# Patient Record
Sex: Female | Born: 1965 | Race: Black or African American | Hispanic: No | Marital: Married | Smoking: Never smoker
Health system: Southern US, Community
[De-identification: ages and names within clinical notes are randomized; demographics above are authoritative.]

## PROBLEM LIST (undated history)

## (undated) DIAGNOSIS — J45909 Unspecified asthma, uncomplicated: Secondary | ICD-10-CM

## (undated) DIAGNOSIS — K219 Gastro-esophageal reflux disease without esophagitis: Secondary | ICD-10-CM

## (undated) DIAGNOSIS — K760 Fatty (change of) liver, not elsewhere classified: Secondary | ICD-10-CM

## (undated) DIAGNOSIS — K829 Disease of gallbladder, unspecified: Secondary | ICD-10-CM

## (undated) DIAGNOSIS — K5900 Constipation, unspecified: Secondary | ICD-10-CM

## (undated) DIAGNOSIS — E785 Hyperlipidemia, unspecified: Secondary | ICD-10-CM

## (undated) DIAGNOSIS — F32A Depression, unspecified: Secondary | ICD-10-CM

## (undated) DIAGNOSIS — R112 Nausea with vomiting, unspecified: Secondary | ICD-10-CM

## (undated) DIAGNOSIS — G4730 Sleep apnea, unspecified: Secondary | ICD-10-CM

## (undated) DIAGNOSIS — I10 Essential (primary) hypertension: Secondary | ICD-10-CM

## (undated) DIAGNOSIS — E559 Vitamin D deficiency, unspecified: Secondary | ICD-10-CM

## (undated) DIAGNOSIS — M1990 Unspecified osteoarthritis, unspecified site: Secondary | ICD-10-CM

## (undated) DIAGNOSIS — E119 Type 2 diabetes mellitus without complications: Secondary | ICD-10-CM

## (undated) DIAGNOSIS — J189 Pneumonia, unspecified organism: Secondary | ICD-10-CM

## (undated) DIAGNOSIS — J302 Other seasonal allergic rhinitis: Secondary | ICD-10-CM

## (undated) DIAGNOSIS — F419 Anxiety disorder, unspecified: Secondary | ICD-10-CM

## (undated) DIAGNOSIS — T7840XA Allergy, unspecified, initial encounter: Secondary | ICD-10-CM

## (undated) DIAGNOSIS — M2550 Pain in unspecified joint: Secondary | ICD-10-CM

## (undated) DIAGNOSIS — M549 Dorsalgia, unspecified: Secondary | ICD-10-CM

## (undated) HISTORY — DX: Essential (primary) hypertension: I10

## (undated) HISTORY — DX: Fatty (change of) liver, not elsewhere classified: K76.0

## (undated) HISTORY — DX: Gastro-esophageal reflux disease without esophagitis: K21.9

## (undated) HISTORY — PX: BACK SURGERY: SHX140

## (undated) HISTORY — DX: Hyperlipidemia, unspecified: E78.5

## (undated) HISTORY — DX: Other seasonal allergic rhinitis: J30.2

## (undated) HISTORY — PX: ABDOMINAL HYSTERECTOMY: SHX81

## (undated) HISTORY — DX: Unspecified osteoarthritis, unspecified site: M19.90

## (undated) HISTORY — DX: Dorsalgia, unspecified: M54.9

## (undated) HISTORY — DX: Pain in unspecified joint: M25.50

## (undated) HISTORY — DX: Unspecified asthma, uncomplicated: J45.909

## (undated) HISTORY — DX: Type 2 diabetes mellitus without complications: E11.9

## (undated) HISTORY — DX: Vitamin D deficiency, unspecified: E55.9

## (undated) HISTORY — PX: WRIST SURGERY: SHX841

## (undated) HISTORY — DX: Constipation, unspecified: K59.00

## (undated) HISTORY — DX: Disease of gallbladder, unspecified: K82.9

## (undated) HISTORY — PX: APPENDECTOMY: SHX54

## (undated) HISTORY — DX: Sleep apnea, unspecified: G47.30

## (undated) HISTORY — DX: Allergy, unspecified, initial encounter: T78.40XA

## (undated) HISTORY — DX: Depression, unspecified: F32.A

---

## 2000-04-13 ENCOUNTER — Inpatient Hospital Stay (HOSPITAL_COMMUNITY): Admission: EM | Admit: 2000-04-13 | Discharge: 2000-04-15 | Payer: Self-pay | Admitting: Emergency Medicine

## 2000-04-13 ENCOUNTER — Encounter (INDEPENDENT_AMBULATORY_CARE_PROVIDER_SITE_OTHER): Payer: Self-pay

## 2000-04-13 ENCOUNTER — Encounter: Payer: Self-pay | Admitting: Surgery

## 2000-12-06 ENCOUNTER — Encounter: Payer: Self-pay | Admitting: Family Medicine

## 2000-12-06 ENCOUNTER — Encounter: Admission: RE | Admit: 2000-12-06 | Discharge: 2000-12-06 | Payer: Self-pay | Admitting: Family Medicine

## 2001-06-14 ENCOUNTER — Other Ambulatory Visit: Admission: RE | Admit: 2001-06-14 | Discharge: 2001-06-14 | Payer: Self-pay | Admitting: Obstetrics and Gynecology

## 2003-09-16 ENCOUNTER — Encounter: Admission: RE | Admit: 2003-09-16 | Discharge: 2003-09-16 | Payer: Self-pay | Admitting: Internal Medicine

## 2003-10-22 ENCOUNTER — Inpatient Hospital Stay (HOSPITAL_COMMUNITY): Admission: RE | Admit: 2003-10-22 | Discharge: 2003-10-24 | Payer: Self-pay | Admitting: Neurosurgery

## 2003-11-28 ENCOUNTER — Emergency Department (HOSPITAL_COMMUNITY): Admission: EM | Admit: 2003-11-28 | Discharge: 2003-11-28 | Payer: Self-pay | Admitting: Emergency Medicine

## 2004-04-06 ENCOUNTER — Other Ambulatory Visit: Admission: RE | Admit: 2004-04-06 | Discharge: 2004-04-06 | Payer: Self-pay | Admitting: Obstetrics and Gynecology

## 2006-02-17 ENCOUNTER — Ambulatory Visit (HOSPITAL_COMMUNITY): Admission: RE | Admit: 2006-02-17 | Discharge: 2006-02-17 | Payer: Self-pay | Admitting: Obstetrics and Gynecology

## 2006-06-14 ENCOUNTER — Encounter: Admission: RE | Admit: 2006-06-14 | Discharge: 2006-06-14 | Payer: Self-pay | Admitting: Hematology and Oncology

## 2006-09-15 ENCOUNTER — Emergency Department (HOSPITAL_COMMUNITY): Admission: EM | Admit: 2006-09-15 | Discharge: 2006-09-15 | Payer: Self-pay | Admitting: Emergency Medicine

## 2006-11-19 ENCOUNTER — Emergency Department (HOSPITAL_COMMUNITY): Admission: EM | Admit: 2006-11-19 | Discharge: 2006-11-19 | Payer: Self-pay | Admitting: Emergency Medicine

## 2007-04-17 ENCOUNTER — Emergency Department (HOSPITAL_COMMUNITY): Admission: EM | Admit: 2007-04-17 | Discharge: 2007-04-17 | Payer: Self-pay | Admitting: Emergency Medicine

## 2007-05-13 ENCOUNTER — Emergency Department (HOSPITAL_COMMUNITY): Admission: EM | Admit: 2007-05-13 | Discharge: 2007-05-13 | Payer: Self-pay | Admitting: Emergency Medicine

## 2007-08-02 ENCOUNTER — Ambulatory Visit (HOSPITAL_COMMUNITY): Admission: RE | Admit: 2007-08-02 | Discharge: 2007-08-02 | Payer: Self-pay | Admitting: Family Medicine

## 2008-07-15 ENCOUNTER — Encounter: Admission: RE | Admit: 2008-07-15 | Discharge: 2008-07-15 | Payer: Self-pay | Admitting: Internal Medicine

## 2008-10-28 ENCOUNTER — Encounter: Admission: RE | Admit: 2008-10-28 | Discharge: 2008-10-28 | Payer: Self-pay | Admitting: Emergency Medicine

## 2008-10-30 ENCOUNTER — Encounter: Admission: RE | Admit: 2008-10-30 | Discharge: 2008-10-30 | Payer: Self-pay | Admitting: Emergency Medicine

## 2008-12-23 ENCOUNTER — Encounter: Admission: RE | Admit: 2008-12-23 | Discharge: 2008-12-23 | Payer: Self-pay | Admitting: Neurosurgery

## 2009-01-06 ENCOUNTER — Encounter: Admission: RE | Admit: 2009-01-06 | Discharge: 2009-01-06 | Payer: Self-pay | Admitting: Neurosurgery

## 2009-03-24 ENCOUNTER — Encounter: Admission: RE | Admit: 2009-03-24 | Discharge: 2009-03-24 | Payer: Self-pay | Admitting: Neurosurgery

## 2009-07-28 ENCOUNTER — Encounter: Admission: RE | Admit: 2009-07-28 | Discharge: 2009-07-28 | Payer: Self-pay | Admitting: Neurosurgery

## 2009-11-19 ENCOUNTER — Encounter: Admission: RE | Admit: 2009-11-19 | Discharge: 2009-11-19 | Payer: Self-pay | Admitting: Neurosurgery

## 2010-02-24 ENCOUNTER — Encounter
Admission: RE | Admit: 2010-02-24 | Discharge: 2010-02-24 | Payer: Self-pay | Source: Home / Self Care | Attending: Neurosurgery | Admitting: Neurosurgery

## 2010-03-28 ENCOUNTER — Encounter: Payer: Self-pay | Admitting: Obstetrics and Gynecology

## 2010-06-30 ENCOUNTER — Other Ambulatory Visit: Payer: Self-pay | Admitting: Neurosurgery

## 2010-06-30 DIAGNOSIS — M549 Dorsalgia, unspecified: Secondary | ICD-10-CM

## 2010-07-01 ENCOUNTER — Ambulatory Visit
Admission: RE | Admit: 2010-07-01 | Discharge: 2010-07-01 | Disposition: A | Discharge status: Home / Self Care | Payer: Worker's Compensation | Source: Ambulatory Visit | Attending: Neurosurgery | Admitting: Neurosurgery

## 2010-07-01 DIAGNOSIS — M549 Dorsalgia, unspecified: Secondary | ICD-10-CM
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
## 2010-07-23 NOTE — Discharge Summary (Signed)
[facility]  Patient:    Kristen Holland, Kristen Holland                          MRN: [id number] Adm. Date:  [id number] Disc. Date: [id number] Attending:  Charlton Haws                           Discharge Summary  FINAL DIAGNOSIS:  Acute appendicitis.  HISTORY OF PRESENT ILLNESS:  This patient is a 45 year old who has presented with abdominal pain which began in the right lower abdomen and then moved, actually, into the left lower quadrant somewhat but was associated with some marked tenderness and some slight increase in white count.  CT scan was consistent with an early appendicitis.  HOSPITAL COURSE:  The patient was taken to the operating room and laparoscopic appendectomy performed.  She had a very long appendix which stretched across the midline and pathologically showed acute appendicitis.  She did well postoperatively, was able to be switched to oral pain medications and discharged on the second postoperative day.  She complained of a little calf tenderness, and a Doppler study was negative, and she was able to be discharged on some Mepergan Fortis.  Will follow up in the office in approximately two weeks. DD:  04/25/00 TD:  04/25/00 Job: [id number]

## 2010-07-23 NOTE — Op Note (Signed)
[facility]  Patient:    Kristen Holland, Kristen Holland                          MRN: [id number] Proc. Date: 04/13/00 Adm. Date:  [id number] Attending:  Charlton Haws CC:         Evette Georges, M.D. [facility]   Operative Report  ACCOUNT:  [id number]  PREOPERATIVE DIAGNOSIS:  Early acute appendicitis.  POSTOPERATIVE DIAGNOSIS:  Early acute appendicitis.  OPERATION:  Laparoscopic appendectomy.  SURGEON:  Currie Paris, M.D.  ANESTHESIA:  General endotracheal.  CLINICAL HISTORY:  This patient is a 45 year old woman who has developed abdominal pain over the last 24 hours which has been primary lower abdomen, right as well as left, although it started out on the right side.  No real nausea or vomiting and has been hungry.  White count was 12,000 and because of her clinical findings, we elected to do a CT scan which showed what appeared to be a very long appendix with the distal appendix appearing to be somewhat edematous and actually extending across the midline into the left lower quadrant, consistent with her physical findings.  DESCRIPTION OF PROCEDURE:  The patient was brought to the operating room and after satisfactory general endotracheal anesthesia had been obtained, Foley catheter was placed and the abdomen prepped and draped.  Marcaine 0.25% was used for each incision.  The umbilical incision made first, the fascia opened, and the peritoneal cavity entered under direct vision.  The Hasson was introduced and the abdomen insufflated to 15.  A 5 mm trocar was placed in the right upper quadrant and a 10-11 in the left lower quadrant, taking the instruments through fairly obliquely and being careful to avoid the epigastrium.  The appendix was noted to be long, and the tip did appear to be somewhat edematous with a little bit of thickening of the distal mesoappendix.  It was excessively long.  The appendix was grasped, and I was able to divide  the mesoappendix with the harmonic scalpel down to the base of the appendix.  I then divided the base of the appendix with the GIA, put it in an EndoCatch bag, and brought it out the umbilical port.  The umbilical port was replaced, and we checked for hemostasis.  Everything appeared to be dry.  I could see pulsations right where the appendiceal artery had been divided with the harmonic and although it was completely dry, I put two clips on it for extra security.  There were no other abnormalities noted in the abdomen.  The uterus was surgically absent.  The right ovary appeared to be normal.  I could not visualize the left ovary as it was covered by the sigmoid colon, but there was no evidence of inflammatory process nor any free fluid in the pelvis.  The upper abdomen was normal.  The left lower quadrant port was removed, and there was no bleeding.  The right upper quadrant port was removed, and there was no bleeding.  The abdomen was deflated through the umbilical port and a pursestring tied down.  The skin was closed with 4-0 Monocryl, subcuticular, plus Steri-Strips.  The patient tolerated the procedure well.  There were no operative complications.  All counts were correct.  She was taken to the recovery room in satisfactory condition. DD:  04/14/00 TD:  04/15/00 Job: [id number]

## 2010-07-23 NOTE — H&P (Signed)
[facility]  Patient:    Kristen Holland, Kristen Holland                          MRN: [id number] Proc. Date: 04/13/00 Adm. Date:  [id number] Attending:  Charlton Haws                         History and Physical  CHIEF COMPLAINT:  Abdominal pain.  CLINICAL HISTORY:  This patient is a 45 year old woman who about 24 hours ago developed some abdominal pain that started as sharp pain that really started in the right lower quadrant and has gotten a little bit worse over the last 24 hours. It seems to have moved over towards the midline and also now into the left lower quadrant. She has not had any nausea whatsoever and is in fact currently very hungry. She has not had any diarrhea. Her bowels have been working normally for her. She thought this was starting out as ovulation pain which she says she can tell. She has had a partial hysterectomy but still has her ovaries so she doesnt have menstrual cycles. However, she got concerned because this pain didnt go away as her ovulation pain (which she calls her ovulation pain) usually does. She has had no urinary tract symptoms, no fever or chills. No upper respiratory infection type symptoms.  PAST SURGICAL HISTORY:  She had a tubal ligation followed by a partial hysterectomy.  MEDICATIONS:  She takes Allegra occasionally for allergies.  ALLERGIES:  No known drug allergies.  HABITS:  Smokes none. Alcohol none.  FAMILY HISTORY:  Unremarkable.  REVIEW OF SYSTEMS:  HEENT:  Negative except for the usual URI type problems. CHEST:  No cough or shortness of breath. HEART:  No history of murmurs or hypertension. ABDOMEN:  Negative except for HPI. GU:  Negative except for HPI. EXTREMITIES:  Negative.  PHYSICAL EXAMINATION:  GENERAL:  The patient is a healthy appearing 45 year old who is alert, awake and in no distress.  VITAL SIGNS:  Blood pressure 145/86, pulse 86, temperature 97.5.  HEENT:  Head is normocephalic. Eyes  are nonicteric. Pupils are round and regular.  NECK:  Supple with no masses or thyromegaly.  LUNGS:  Clear to auscultation.  HEART:  Regular, no murmurs, rubs or gallops.  ABDOMEN:  Tender across the lower abdomen, right and left and I think the left seems to be more tender than the right. There is not a lot of rigidity. There is some rebound tenderness present and it seems to be across the lower abdomen and not the upper abdomen. Bowel sounds are present.  RECTAL:  Not done.  EXTREMITIES:  Show no cyanosis or edema.  LABORATORY DATA:  Show a normal CMET. The white count is 12,900. Urinalysis appears negative.  IMPRESSION:  Abdominal pain of uncertain etiology.  PLAN:  Will go ahead and get a CT because I dont think this is quite a classical appendicitis although this is most likely what this represents. Once that is done we can make further plans. DD:  04/13/00 TD:  04/15/00 Job: [id number]
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
## 2010-08-24 ENCOUNTER — Other Ambulatory Visit (HOSPITAL_COMMUNITY): Payer: Self-pay | Admitting: Gastroenterology

## 2010-09-16 ENCOUNTER — Encounter (HOSPITAL_COMMUNITY)
Admission: RE | Admit: 2010-09-16 | Discharge: 2010-09-16 | Disposition: A | Discharge status: Home / Self Care | Payer: BC Managed Care – PPO | Source: Ambulatory Visit | Attending: Gastroenterology | Admitting: Gastroenterology

## 2010-09-16 ENCOUNTER — Ambulatory Visit (HOSPITAL_COMMUNITY)
Admission: RE | Admit: 2010-09-16 | Discharge: 2010-09-16 | Disposition: A | Discharge status: Home / Self Care | Payer: Worker's Compensation | Source: Ambulatory Visit | Attending: Gastroenterology | Admitting: Gastroenterology

## 2010-09-16 ENCOUNTER — Encounter (HOSPITAL_COMMUNITY): Payer: Self-pay

## 2010-09-16 DIAGNOSIS — R109 Unspecified abdominal pain: Secondary | ICD-10-CM | POA: Insufficient documentation

## 2010-09-16 DIAGNOSIS — R112 Nausea with vomiting, unspecified: Secondary | ICD-10-CM | POA: Insufficient documentation

## 2010-09-16 HISTORY — DX: Nausea with vomiting, unspecified: R11.2

## 2010-09-16 MED ORDER — SINCALIDE 5 MCG IJ SOLR: 0.0200 ug/kg | Freq: Once | INTRAMUSCULAR | Status: DC

## 2010-09-16 MED ORDER — TECHNETIUM TC 99M MEBROFENIN IV KIT
5.5000 | PACK | Freq: Once | INTRAVENOUS | Status: AC | PRN
  Administered 2010-09-16: 5.5 via INTRAVENOUS

## 2010-10-08 ENCOUNTER — Other Ambulatory Visit: Payer: Self-pay | Admitting: Neurosurgery

## 2010-10-08 DIAGNOSIS — S39012A Strain of muscle, fascia and tendon of lower back, initial encounter: Secondary | ICD-10-CM

## 2010-10-14 ENCOUNTER — Ambulatory Visit
Admission: RE | Admit: 2010-10-14 | Discharge: 2010-10-14 | Disposition: A | Discharge status: Home / Self Care | Payer: Worker's Compensation | Source: Ambulatory Visit | Attending: Neurosurgery | Admitting: Neurosurgery

## 2010-10-14 DIAGNOSIS — M549 Dorsalgia, unspecified: Secondary | ICD-10-CM

## 2010-10-14 DIAGNOSIS — S39012A Strain of muscle, fascia and tendon of lower back, initial encounter: Secondary | ICD-10-CM

## 2010-10-14 MED ORDER — IOHEXOL 180 MG/ML  SOLN
17.0000 mL | Freq: Once | INTRAMUSCULAR | Status: AC | PRN
  Administered 2010-10-14: 17 mL via INTRATHECAL

## 2010-10-14 MED ORDER — DIAZEPAM 2 MG PO TABS
10.0000 mg | ORAL_TABLET | Freq: Once | ORAL | Status: AC
  Administered 2010-10-14: 10 mg via ORAL

## 2010-10-14 MED ORDER — SODIUM CHLORIDE 0.9 % IV SOLN: 4.0000 mg | Freq: Four times a day (QID) | INTRAVENOUS | Status: DC | PRN

## 2010-10-14 NOTE — Progress Notes (Signed)
Pt resting quietly without complaint

## 2010-11-16 ENCOUNTER — Other Ambulatory Visit: Payer: Self-pay | Admitting: Gastroenterology

## 2010-11-17 ENCOUNTER — Ambulatory Visit
Admission: RE | Admit: 2010-11-17 | Discharge: 2010-11-17 | Disposition: A | Discharge status: Home / Self Care | Payer: BC Managed Care – PPO | Source: Ambulatory Visit | Attending: Gastroenterology | Admitting: Gastroenterology

## 2010-12-17 LAB — CBC
HCT: 39.1
MCHC: 35.1
MCV: 81.1
Platelets: 376
RDW: 12.6

## 2010-12-17 LAB — COMPREHENSIVE METABOLIC PANEL
AST: 18
Albumin: 3.9
BUN: 8
Calcium: 9.4
Chloride: 106
Creatinine, Ser: 0.89
GFR calc Af Amer: >60
Total Bilirubin: 0.7
Total Protein: 7.2

## 2010-12-17 LAB — DIFFERENTIAL
Basophils Absolute: 0.1
Lymphocytes Relative: 30
Lymphs Abs: 2.9
Monocytes Absolute: 0.5
Neutro Abs: 5.9

## 2011-01-06 ENCOUNTER — Ambulatory Visit
Admission: RE | Admit: 2011-01-06 | Discharge: 2011-01-06 | Disposition: A | Discharge status: Home / Self Care | Payer: BC Managed Care – PPO | Source: Ambulatory Visit | Attending: Family Medicine | Admitting: Family Medicine

## 2011-01-06 ENCOUNTER — Other Ambulatory Visit: Payer: Self-pay | Admitting: Family Medicine

## 2011-01-06 DIAGNOSIS — R52 Pain, unspecified: Secondary | ICD-10-CM

## 2011-10-16 ENCOUNTER — Ambulatory Visit: Payer: BC Managed Care – PPO

## 2011-10-16 ENCOUNTER — Ambulatory Visit (INDEPENDENT_AMBULATORY_CARE_PROVIDER_SITE_OTHER): Payer: BC Managed Care – PPO | Admitting: Emergency Medicine

## 2011-10-16 VITALS — BP 130/86 | HR 88 | Temp 98.1°F | Resp 22 | Ht 65.0 in | Wt 209.6 lb

## 2011-10-16 DIAGNOSIS — K5900 Constipation, unspecified: Secondary | ICD-10-CM

## 2011-10-16 DIAGNOSIS — R109 Unspecified abdominal pain: Secondary | ICD-10-CM

## 2011-10-16 DIAGNOSIS — R062 Wheezing: Secondary | ICD-10-CM

## 2011-10-16 LAB — POCT URINALYSIS DIPSTICK
Ketones, UA: NEGATIVE
Protein, UA: NEGATIVE
Spec Grav, UA: 1.02
pH, UA: 7

## 2011-10-16 LAB — POCT CBC
HCT, POC: 42.2 % (ref 37.7–47.9)
Lymph, poc: 2.8 (ref 0.6–3.4)
MCHC: 31 g/dL — AB (ref 31.8–35.4)
MCV: 86.7 fL (ref 80–97)
MID (cbc): 0.5 (ref 0–0.9)
POC Granulocyte: 5 (ref 2–6.9)
POC LYMPH PERCENT: 33.6 %L (ref 10–50)
Platelet Count, POC: 393 10*3/uL (ref 142–424)
RDW, POC: 12.5 %

## 2011-10-16 LAB — POCT UA - MICROSCOPIC ONLY
Casts, Ur, LPF, POC: NEGATIVE
Crystals, Ur, HPF, POC: NEGATIVE
Yeast, UA: NEGATIVE

## 2011-10-16 MED ORDER — DICYCLOMINE HCL 20 MG PO TABS: 20.0000 mg | ORAL_TABLET | Freq: Four times a day (QID) | ORAL | Status: DC

## 2011-10-16 NOTE — Patient Instructions (Addendum)

## 2011-10-16 NOTE — Progress Notes (Signed)
  Subjective:    Patient ID: Kristen Holland, female    DOB: 06-Aug-1965, 46 y.o.   MRN: [id number]  HPI patient had an onset Wednesday night of right lower abdominal pain. She is status post hysterectomy and status post appendectomy. She still has her right ovary. She's had no fever. She has had no nausea or vomiting. She had one episode of loose stools. She has no dysuria or urinary frequency. Her pain level is approximately 3/10 aching sensation in the right lower abdomen.    Review of Systems     Objective:   Physical Exam  Constitutional: She appears well-developed and well-nourished.  Eyes: Pupils are equal, round, and reactive to light.  Neck: No thyromegaly present.  Cardiovascular: Normal rate and regular rhythm.   Pulmonary/Chest: Effort normal and breath sounds normal.  Abdominal: Soft. Bowel sounds are normal. She exhibits no mass. There is tenderness. There is guarding. There is no rebound.       The area of tenderness is right mid to lower abdomen   [facility] reading (PRIMARY) by  Dr.Keddrick Wyne is a large amount of stool burden involving the entire colon   Results for orders placed in visit on 10/16/11  POCT CBC      Component Value Range   WBC 8.3  4.6 - 10.2 K/uL   Lymph, poc 2.8  0.6 - 3.4   POC LYMPH PERCENT 33.6  10 - 50 %L   MID (cbc) 0.5  0 - 0.9   POC MID % 6.6  0 - 12 %M   POC Granulocyte 5.0  2 - 6.9   Granulocyte percent 59.8  37 - 80 %G   RBC 4.87  4.04 - 5.48 M/uL   Hemoglobin 13.1  12.2 - 16.2 g/dL   HCT, POC 40.9  81.1 - 47.9 %   MCV 86.7  80 - 97 fL   MCH, POC 26.9 (*) 27 - 31.2 pg   MCHC 31.0 (*) 31.8 - 35.4 g/dL   RDW, POC 91.4     Platelet Count, POC 393  142 - 424 K/uL   MPV 9.4  0 - 99.8 fL  POCT URINALYSIS DIPSTICK      Component Value Range   Color, UA yellow     Clarity, UA clear     Glucose, UA neg     Bilirubin, UA neg     Ketones, UA neg     Spec Grav, UA 1.020     Blood, UA neg     pH, UA 7.0     Protein, UA neg     Urobilinogen, UA 0.2       Nitrite, UA neg     Leukocytes, UA Negative    POCT UA - MICROSCOPIC ONLY      Component Value Range   WBC, Ur, HPF, POC 2-3     RBC, urine, microscopic 0-1     Bacteria, U Microscopic trace     Mucus, UA small     Epithelial cells, urine per micros 2-3     Crystals, Ur, HPF, POC neg     Casts, Ur, LPF, POC neg     Yeast, UA neg         Assessment & Plan:  Abdominal pain secondary to constipation. We will treat this with Bentyl for cramping and give her MiraLax to see if we can get this to resolve.

## 2011-11-16 ENCOUNTER — Other Ambulatory Visit: Payer: Self-pay | Admitting: Gastroenterology

## 2011-11-16 DIAGNOSIS — R1011 Right upper quadrant pain: Secondary | ICD-10-CM

## 2011-11-16 DIAGNOSIS — R110 Nausea: Secondary | ICD-10-CM

## 2011-11-24 ENCOUNTER — Ambulatory Visit (HOSPITAL_COMMUNITY)
Admission: RE | Admit: 2011-11-24 | Discharge: 2011-11-24 | Disposition: A | Discharge status: Home / Self Care | Payer: BC Managed Care – PPO | Source: Ambulatory Visit | Attending: Gastroenterology | Admitting: Gastroenterology

## 2011-11-24 ENCOUNTER — Other Ambulatory Visit (HOSPITAL_COMMUNITY): Payer: Self-pay | Admitting: General Practice

## 2011-11-24 DIAGNOSIS — I10 Essential (primary) hypertension: Secondary | ICD-10-CM | POA: Insufficient documentation

## 2011-11-24 DIAGNOSIS — R1011 Right upper quadrant pain: Secondary | ICD-10-CM

## 2011-11-24 DIAGNOSIS — E119 Type 2 diabetes mellitus without complications: Secondary | ICD-10-CM | POA: Insufficient documentation

## 2011-11-24 DIAGNOSIS — R110 Nausea: Secondary | ICD-10-CM

## 2011-11-24 DIAGNOSIS — K7689 Other specified diseases of liver: Secondary | ICD-10-CM | POA: Insufficient documentation

## 2011-11-24 MED ORDER — SINCALIDE 5 MCG IJ SOLR
0.0200 ug/kg | Freq: Once | INTRAMUSCULAR | Status: AC
  Administered 2011-11-24: 1.9 ug via INTRAVENOUS

## 2011-11-24 MED ORDER — TECHNETIUM TC 99M MEBROFENIN IV KIT
5.0000 | PACK | Freq: Once | INTRAVENOUS | Status: AC | PRN
  Administered 2011-11-24: 5 via INTRAVENOUS

## 2011-12-29 ENCOUNTER — Ambulatory Visit (INDEPENDENT_AMBULATORY_CARE_PROVIDER_SITE_OTHER): Payer: BC Managed Care – PPO | Admitting: General Surgery

## 2011-12-29 ENCOUNTER — Encounter (INDEPENDENT_AMBULATORY_CARE_PROVIDER_SITE_OTHER): Payer: Self-pay | Admitting: General Surgery

## 2011-12-29 VITALS — BP 123/84 | HR 82 | Temp 98.6°F | Resp 14 | Ht 65.0 in | Wt 215.4 lb

## 2011-12-29 DIAGNOSIS — E119 Type 2 diabetes mellitus without complications: Secondary | ICD-10-CM

## 2011-12-29 DIAGNOSIS — R1011 Right upper quadrant pain: Secondary | ICD-10-CM

## 2011-12-29 DIAGNOSIS — K5900 Constipation, unspecified: Secondary | ICD-10-CM | POA: Insufficient documentation

## 2011-12-29 NOTE — Progress Notes (Signed)
Chief Complaint  Patient presents with  . Abdominal Pain    gallbladder    HISTORY:  Kristen Holland is a 46 y.o. female who presents to clinic with RUQ pain.  Her pain appears to be episodic.  She is currently not having much pain since starting the Dexilant.  She states that is occurs about 30 mins after eating.  It is worse with spicy foods.  She also complains of occasional constipation.  Past Medical History  Diagnosis Date  . Abdominal pain   . Nausea & vomiting   . Diabetes mellitus without complication   . Hyperlipidemia   . Hypertension   . GERD (gastroesophageal reflux disease)   . Asthma   . Arthritis        History reviewed. No pertinent past surgical history.    Current Outpatient Prescriptions  Medication Sig Dispense Refill  . dicyclomine (BENTYL) 20 MG tablet Take 1 tablet (20 mg total) by mouth every 6 (six) hours.  30 tablet  o  . Liraglutide (VICTOZA) 18 MG/3ML SOLN Inject into the skin daily.      . metFORMIN (GLUCOPHAGE) 500 MG tablet Take 500 mg by mouth daily with breakfast.      . montelukast (SINGULAIR) 10 MG tablet Take 10 mg by mouth at bedtime.      . simvastatin (ZOCOR) 20 MG tablet Take 20 mg by mouth every evening.      . valsartan-hydrochlorothiazide (DIOVAN-HCT) 160-12.5 MG per tablet Take 1 tablet by mouth daily.         No Known Allergies    History reviewed. No pertinent family history.    History   Social History  . Marital Status: Married    Spouse Name: N/A    Number of Children: N/A  . Years of Education: N/A   Social History Main Topics  . Smoking status: Never Smoker   . Smokeless tobacco: None  . Alcohol Use: No  . Drug Use: No  . Sexually Active: None   Other Topics Concern  . None   Social History Narrative  . None       REVIEW OF SYSTEMS - PERTINENT POSITIVES ONLY: Review of Systems - General ROS: negative for - chills, fever, weight gain or weight loss Hematological and Lymphatic ROS: negative for - bleeding  problems, blood clots or jaundice Respiratory ROS: no cough, shortness of breath, or wheezing Cardiovascular ROS: no chest pain or dyspnea on exertion Gastrointestinal ROS: negative for - blood in stools or nausea/vomiting Pain in RUQ  EXAM: Filed Vitals:   12/29/11 1500  BP: 123/84  Pulse: 82  Temp: 98.6 F (37 C)  Resp: 14    General appearance: alert, cooperative and no distress Resp: clear to auscultation bilaterally Cardio: regular rate and rhythm GI: soft, obese, tender to palpation in RUQ, Neg Holman's sign   LABORATORY RESULTS: Available labs are reviewed  Lab Results  Component Value Date   ALT 22 11/19/2006   AST 18 11/19/2006   ALKPHOS 49 11/19/2006   BILITOT 0.7 11/19/2006     RADIOLOGY RESULTS:   Images and reports are reviewed. RUQ US IMPRESSION:  1. No evidence of cholelithiasis, hydronephrosis, or other acute findings.  2. Mild hepatic steatosis.  HIDA IMPRESSION:  No cystic duct obstruction. Post CCK gallbladder ejection fraction is 28.5%. Normal gallbladder ejection fraction should be greater than 30%.    ASSESSMENT AND PLAN: Kristen Holland is a 46 y.o. female who was referred to me for RUQ pain.  This is episodic in nature.  It is getting better on her Dexilant.  I discussed with her the risks and benefits of surgery.  I told her that 80% of people with her findings on HIDA will get better after cholecystectomy.  She is currently feeling better and has decided to wait for now.  I feel that this is perfectly safe since she has no gallstones.  She is going to try to work on her constipation and weight loss.  She will call the office if her symptoms worsen.      Vanita Panda, MD Colon and Rectal Surgery / General Surgery [facility]      Visit Diagnoses: 1. RUQ pain     Primary Care Physician: Gwynneth Aliment, MD

## 2011-12-29 NOTE — Patient Instructions (Signed)
If your pain gets worse, please call the office.

## 2012-03-22 ENCOUNTER — Ambulatory Visit (INDEPENDENT_AMBULATORY_CARE_PROVIDER_SITE_OTHER): Payer: BC Managed Care – PPO | Admitting: Family Medicine

## 2012-03-22 VITALS — BP 135/84 | HR 121 | Temp 97.9°F | Resp 16 | Ht 67.0 in | Wt 216.0 lb

## 2012-03-22 DIAGNOSIS — E119 Type 2 diabetes mellitus without complications: Secondary | ICD-10-CM

## 2012-03-22 DIAGNOSIS — E1169 Type 2 diabetes mellitus with other specified complication: Secondary | ICD-10-CM

## 2012-03-22 DIAGNOSIS — M79605 Pain in left leg: Secondary | ICD-10-CM

## 2012-03-22 DIAGNOSIS — M545 Low back pain, unspecified: Secondary | ICD-10-CM

## 2012-03-22 MED ORDER — KETOROLAC TROMETHAMINE 60 MG/2ML IM SOLN: 60.0000 mg | Freq: Once | INTRAMUSCULAR | Status: DC

## 2012-03-22 MED ORDER — KETOROLAC TROMETHAMINE 60 MG/2ML IM SOLN
60.0000 mg | Freq: Once | INTRAMUSCULAR | Status: AC
  Administered 2012-03-22: 60 mg via INTRAMUSCULAR

## 2012-03-22 MED ORDER — OXAPROZIN 600 MG PO TABS: ORAL_TABLET | ORAL | Status: DC

## 2012-03-22 MED ORDER — TRAMADOL HCL 50 MG PO TABS: 50.0000 mg | ORAL_TABLET | Freq: Three times a day (TID) | ORAL | Status: DC | PRN

## 2012-03-22 MED ORDER — PREDNISONE 20 MG PO TABS: ORAL_TABLET | ORAL | Status: DC

## 2012-03-22 NOTE — Progress Notes (Signed)
Subjective: 47 year old lady with history of moderately severe pain in her left low back.. This started yesterday. No injury. 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 She is married. Knows of no specific injuries. The pain goes down into her left leg. Certain positions seem to flare worse. No GI or GU symptoms.  She is diabetic her diabetes has been good control. Her sugar was 91 today. She has a regular primary care doctor.  Objective: Left SI joint region is tender. The rest of the low back does not seem to be particularly tender. She does not have that much tenderness down the spine directly. Skin looks intact. Her straight leg raising test seated is mildly positive on the left we in extended to a full 90. The straight leg raising test has some contralateral pain on raising the right leg, with pain in the left leg at about 75. The pain is radicular in nature. She hurts the most when she tries to sit up or roll over. Deep tendon reflexes were 1+, symmetrical.  Assessment: Low back pain with left radiculopathy Diabetes mellitus  Plan: Treat with Daypro twice a day. If she's not doing better after couple of days she is to take the prednisone per prescription, but we will not have her take that if she is doing better. Already has Flexeril for muscle relaxant. This should not be taken when driving.

## 2012-03-22 NOTE — Patient Instructions (Signed)
Taking the Daypro one pill twice daily with food for pain and inflammation in your back  If not doing better in the next couple of days, or if worse at any time, begin on the prednisone in a tapered dose fashion as directed. If you do not use the prednisone over the next week I would recommend destroyed the prescription so he did not ever accidentally take it. It can raise her blood sugar.  Take the Flexeril that you have at bedtime for muscle relaxant  Use the tramadol only if needed for severe back pain

## 2012-04-29 ENCOUNTER — Other Ambulatory Visit: Payer: Self-pay | Admitting: Family Medicine

## 2012-05-02 ENCOUNTER — Other Ambulatory Visit: Payer: Self-pay | Admitting: Family Medicine

## 2012-07-12 ENCOUNTER — Ambulatory Visit (INDEPENDENT_AMBULATORY_CARE_PROVIDER_SITE_OTHER): Payer: BC Managed Care – PPO | Admitting: Family Medicine

## 2012-07-12 VITALS — BP 116/81 | HR 109 | Temp 99.8°F | Resp 16 | Ht 65.0 in | Wt 218.0 lb

## 2012-07-12 DIAGNOSIS — R509 Fever, unspecified: Secondary | ICD-10-CM

## 2012-07-12 DIAGNOSIS — R05 Cough: Secondary | ICD-10-CM

## 2012-07-12 DIAGNOSIS — B349 Viral infection, unspecified: Secondary | ICD-10-CM

## 2012-07-12 DIAGNOSIS — B9789 Other viral agents as the cause of diseases classified elsewhere: Secondary | ICD-10-CM

## 2012-07-12 DIAGNOSIS — E119 Type 2 diabetes mellitus without complications: Secondary | ICD-10-CM

## 2012-07-12 DIAGNOSIS — R059 Cough, unspecified: Secondary | ICD-10-CM

## 2012-07-12 LAB — POCT INFLUENZA A/B
Influenza A, POC: NEGATIVE
Influenza B, POC: NEGATIVE

## 2012-07-12 MED ORDER — HYDROCOD POLST-CHLORPHEN POLST 10-8 MG/5ML PO LQCR: 5.0000 mL | Freq: Two times a day (BID) | ORAL | Status: DC | PRN

## 2012-07-12 MED ORDER — ALBUTEROL SULFATE (2.5 MG/3ML) 0.083% IN NEBU: 2.5000 mg | INHALATION_SOLUTION | Freq: Four times a day (QID) | RESPIRATORY_TRACT | Status: DC | PRN

## 2012-07-12 MED ORDER — BENZONATATE 100 MG PO CAPS: ORAL_CAPSULE | ORAL | Status: DC

## 2012-07-12 NOTE — Patient Instructions (Signed)
Drink plenty of fluids Get sufficient rest Stay off work tomorrow Return if worse  Tessalon one or 2 pills every 6 or 8 hours as needed for cough during waking hours. Use the remaining Tussionex at night. I will give a prescription for some more Tussionex if she needs it.  Use inhaler 2 puffs every 4-6 hours as needed for wheezing or coughing.

## 2012-07-12 NOTE — Progress Notes (Signed)
Subjective: 47 year old lady who has been sick since yesterday. She developed a cough, then got febrile and developed bad body aches. She aches all over. 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 No one else at home has been ill. 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 A couple of them however. She does not smoke. She is diabetic. Did not take medications today however she?. She did takes cough medication, Tussionex, which helped the cough.  Objective: Somewhat ill-appearing lady. We'll touch. TMs normal. Throat clear. Neck supple without significant nodes. Chest is a few scattered wheezes especially on the left. Heart regular without murmurs.  Assessment: Viral syndrome Fever Body Cough Type 2 diabetes  Plan: Check flu swab  Results for orders placed in visit on 07/12/12  POCT INFLUENZA A/B      Result Value Range   Influenza A, POC Negative     Influenza B, POC Negative    GLUCOSE, POCT (MANUAL RESULT ENTRY)      Result Value Range   POC Glucose 85  70 - 99 mg/dl   Will treat symptomatically for a viral flulike illness.  Albuterol inhaler Tessalon pearls Tylenol or ibuprofen Fluids Rest

## 2012-07-14 ENCOUNTER — Telehealth: Payer: Self-pay

## 2012-07-14 NOTE — Telephone Encounter (Signed)
PATIENT STATES SHE HAS BEEN WAITING ON A PHONE CALL SINCE Thursday. SAYS MEDICATION IS NOT WORKING. WOULD LIKE TRAMADOL TO BE CALLED IN TO [facility] ON [address]. [phone]

## 2012-07-15 ENCOUNTER — Ambulatory Visit: Payer: BC Managed Care – PPO

## 2012-07-15 ENCOUNTER — Ambulatory Visit (INDEPENDENT_AMBULATORY_CARE_PROVIDER_SITE_OTHER): Payer: BC Managed Care – PPO | Admitting: Family Medicine

## 2012-07-15 VITALS — BP 110/76 | HR 106 | Temp 98.2°F | Resp 16 | Ht 65.0 in | Wt 214.4 lb

## 2012-07-15 DIAGNOSIS — R358 Other polyuria: Secondary | ICD-10-CM

## 2012-07-15 DIAGNOSIS — R3589 Other polyuria: Secondary | ICD-10-CM

## 2012-07-15 DIAGNOSIS — R0602 Shortness of breath: Secondary | ICD-10-CM

## 2012-07-15 DIAGNOSIS — E119 Type 2 diabetes mellitus without complications: Secondary | ICD-10-CM

## 2012-07-15 LAB — POCT GLYCOSYLATED HEMOGLOBIN (HGB A1C): Hemoglobin A1C: 5.3

## 2012-07-15 LAB — POCT URINALYSIS DIPSTICK
Bilirubin, UA: NEGATIVE
Glucose, UA: NEGATIVE
Ketones, UA: NEGATIVE
Leukocytes, UA: NEGATIVE
Nitrite, UA: NEGATIVE
Protein, UA: NEGATIVE
Spec Grav, UA: 1.02
Urobilinogen, UA: 0.2
pH, UA: 5.5

## 2012-07-15 LAB — POCT CBC
Granulocyte percent: 61 %G (ref 37–80)
HCT, POC: 43 % (ref 37.7–47.9)
Hemoglobin: 13.7 g/dL (ref 12.2–16.2)
Lymph, poc: 2.2 (ref 0.6–3.4)
MCH, POC: 28 pg (ref 27–31.2)
MCHC: 31.9 g/dL (ref 31.8–35.4)
MCV: 87.9 fL (ref 80–97)
MID (cbc): 0.4 (ref 0–0.9)
MPV: 8.5 fL (ref 0–99.8)
POC Granulocyte: 4 (ref 2–6.9)
POC LYMPH PERCENT: 32.7 %L (ref 10–50)
POC MID %: 6.3 %M (ref 0–12)
Platelet Count, POC: 287 10*3/uL (ref 142–424)
RBC: 4.89 M/uL (ref 4.04–5.48)
RDW, POC: 13 %
WBC: 6.6 10*3/uL (ref 4.6–10.2)

## 2012-07-15 LAB — POCT UA - MICROSCOPIC ONLY
Bacteria, U Microscopic: NEGATIVE
Casts, Ur, LPF, POC: NEGATIVE
Crystals, Ur, HPF, POC: NEGATIVE
Mucus, UA: NEGATIVE
RBC, urine, microscopic: NEGATIVE
Yeast, UA: NEGATIVE

## 2012-07-15 MED ORDER — METHYLPREDNISOLONE ACETATE 80 MG/ML IJ SUSP
80.0000 mg | Freq: Once | INTRAMUSCULAR | Status: AC
  Administered 2012-07-15: 80 mg via INTRAMUSCULAR

## 2012-07-15 NOTE — Telephone Encounter (Signed)
Spoke with patient and she states still having cough, breathing is worsed the pain in her back/L side is bad. She mentioned Tramadol because listed on med list on AVS. I exained why that was on there. She was advised that since her breathing worse and she is having pain RTC for further evaluation.

## 2012-07-15 NOTE — Progress Notes (Signed)
47 yo with shortness of breath x 24 hours.  She initially started coughing on Wednesday with aching in legs as well.  She felt weak and she was started on medicine, restarted on albuterol for the wheezing, but she continues to cough with scant phlegm produced.  Chest is aching substernally.  She has been having intermittent fevers since.   Having trouble sleeping because of coughing  F/Hx:  Mother died of lung cancer, Dad died of kidney failure, HTN  PMHx:  Asthma attacks when younger, last one 4-5 years ago.  Social:  School bus driver  Objective:  NAD HEENT:  Normal Chest:  No wheezing or ronchi or rales Skin:  Warm and dry without rashes Neck:  Supple, no adenopathy or thyromegaly Ext:  No edema, calf or thigh tenderness.  [facility] reading (PRIMARY) by  Dr. Milus Glazier:  CXR:  Mild peribronchial cuffing Results for orders placed in visit on 07/15/12  POCT GLYCOSYLATED HEMOGLOBIN (HGB A1C)      Result Value Range   Hemoglobin A1C 5.3    POCT CBC      Result Value Range   WBC 6.6  4.6 - 10.2 K/uL   Lymph, poc 2.2  0.6 - 3.4   POC LYMPH PERCENT 32.7  10 - 50 %L   MID (cbc) 0.4  0 - 0.9   POC MID % 6.3  0 - 12 %M   POC Granulocyte 4.0  2 - 6.9   Granulocyte percent 61.0  37 - 80 %G   RBC 4.89  4.04 - 5.48 M/uL   Hemoglobin 13.7  12.2 - 16.2 g/dL   HCT, POC 16.1  09.6 - 47.9 %   MCV 87.9  80 - 97 fL   MCH, POC 28.0  27 - 31.2 pg   MCHC 31.9  31.8 - 35.4 g/dL   RDW, POC 04.5     Platelet Count, POC 287  142 - 424 K/uL   MPV 8.5  0 - 99.8 fL  POCT UA - MICROSCOPIC ONLY      Result Value Range   WBC, Ur, HPF, POC 2-3     RBC, urine, microscopic neg     Bacteria, U Microscopic neg     Mucus, UA neg     Epithelial cells, urine per micros 0-1     Crystals, Ur, HPF, POC neg     Casts, Ur, LPF, POC neg     Yeast, UA neg    POCT URINALYSIS DIPSTICK      Result Value Range   Color, UA yellow     Clarity, UA clear     Glucose, UA neg     Bilirubin, UA neg     Ketones, UA neg     Spec Grav, UA 1.020     Blood, UA small     pH, UA 5.5     Protein, UA neg     Urobilinogen, UA 0.2     Nitrite, UA neg     Leukocytes, UA Negative      Assessment:  Bronchitis, acute.  Plan:

## 2012-09-01 ENCOUNTER — Emergency Department (HOSPITAL_COMMUNITY): Payer: BC Managed Care – PPO

## 2012-09-01 ENCOUNTER — Encounter (HOSPITAL_COMMUNITY): Payer: Self-pay | Admitting: *Deleted

## 2012-09-01 ENCOUNTER — Emergency Department (HOSPITAL_COMMUNITY)
Admission: EM | Admit: 2012-09-01 | Discharge: 2012-09-01 | Disposition: A | Discharge status: Home / Self Care | Payer: BC Managed Care – PPO | Attending: Emergency Medicine | Admitting: Emergency Medicine

## 2012-09-01 ENCOUNTER — Other Ambulatory Visit: Payer: Self-pay

## 2012-09-01 DIAGNOSIS — E785 Hyperlipidemia, unspecified: Secondary | ICD-10-CM | POA: Insufficient documentation

## 2012-09-01 DIAGNOSIS — I10 Essential (primary) hypertension: Secondary | ICD-10-CM | POA: Insufficient documentation

## 2012-09-01 DIAGNOSIS — Z791 Long term (current) use of non-steroidal anti-inflammatories (NSAID): Secondary | ICD-10-CM | POA: Insufficient documentation

## 2012-09-01 DIAGNOSIS — M129 Arthropathy, unspecified: Secondary | ICD-10-CM | POA: Insufficient documentation

## 2012-09-01 DIAGNOSIS — Z79899 Other long term (current) drug therapy: Secondary | ICD-10-CM | POA: Insufficient documentation

## 2012-09-01 DIAGNOSIS — J45909 Unspecified asthma, uncomplicated: Secondary | ICD-10-CM | POA: Insufficient documentation

## 2012-09-01 DIAGNOSIS — K219 Gastro-esophageal reflux disease without esophagitis: Secondary | ICD-10-CM | POA: Insufficient documentation

## 2012-09-01 DIAGNOSIS — E119 Type 2 diabetes mellitus without complications: Secondary | ICD-10-CM | POA: Insufficient documentation

## 2012-09-01 DIAGNOSIS — M549 Dorsalgia, unspecified: Secondary | ICD-10-CM | POA: Insufficient documentation

## 2012-09-01 DIAGNOSIS — R079 Chest pain, unspecified: Secondary | ICD-10-CM | POA: Insufficient documentation

## 2012-09-01 DIAGNOSIS — Z7982 Long term (current) use of aspirin: Secondary | ICD-10-CM | POA: Insufficient documentation

## 2012-09-01 LAB — CBC
HCT: 37.9 % (ref 36.0–46.0)
Hemoglobin: 12.3 g/dL (ref 12.0–15.0)
MCH: 27.2 pg (ref 26.0–34.0)
MCHC: 32.5 g/dL (ref 30.0–36.0)
MCV: 83.7 fL (ref 78.0–100.0)
Platelets: 382 10*3/uL (ref 150–400)
RBC: 4.53 MIL/uL (ref 3.87–5.11)
RDW: 13.3 % (ref 11.5–15.5)
WBC: 10.4 10*3/uL (ref 4.0–10.5)

## 2012-09-01 LAB — BASIC METABOLIC PANEL
CO2: 23 mEq/L (ref 19–32)
Chloride: 102 mEq/L (ref 96–112)
Creatinine, Ser: 0.71 mg/dL (ref 0.50–1.10)

## 2012-09-01 LAB — POCT I-STAT TROPONIN I: Troponin i, poc: 0.02 ng/mL (ref 0.00–0.08)

## 2012-09-01 LAB — APTT: aPTT: 35 seconds (ref 24–37)

## 2012-09-01 MED ORDER — ASPIRIN 325 MG PO TABS
325.0000 mg | ORAL_TABLET | ORAL | Status: AC
Start: 2012-09-01 — End: 2012-09-01
  Administered 2012-09-01: 325 mg via ORAL
  Filled 2012-09-01: qty 1

## 2012-09-01 MED ORDER — GI COCKTAIL ~~LOC~~
30.0000 mL | Freq: Once | ORAL | Status: AC
  Administered 2012-09-01: 30 mL via ORAL
  Filled 2012-09-01: qty 30

## 2012-09-01 MED ORDER — PANTOPRAZOLE SODIUM 40 MG IV SOLR
40.0000 mg | Freq: Once | INTRAVENOUS | Status: AC
  Administered 2012-09-01: 40 mg via INTRAVENOUS
  Filled 2012-09-01: qty 40

## 2012-09-01 MED ORDER — MORPHINE SULFATE 4 MG/ML IJ SOLN
4.0000 mg | Freq: Once | INTRAMUSCULAR | Status: AC
  Administered 2012-09-01: 4 mg via INTRAVENOUS
  Filled 2012-09-01: qty 1

## 2012-09-01 NOTE — ED Provider Notes (Signed)
History    CSN: [id number] Arrival date & time 09/01/12  1940  First MD Initiated Contact with Patient 09/01/12 2052     Chief Complaint  Patient presents with  . Chest Pain   (Consider location/radiation/quality/duration/timing/severity/associated sxs/prior Treatment) Patient is a 47 y.o. female presenting with chest pain. The history is provided by the patient.  Chest Pain Pain location:  L chest Pain quality: aching   Pain radiates to:  L shoulder and upper back Pain radiates to the back: yes   Pain severity:  Moderate Timing:  Constant Associated symptoms: back pain   Associated symptoms: no abdominal pain, no cough, no fever, no nausea and no shortness of breath   Associated symptoms comment:  Pain that started last night in left chest and remains constant. She reports increased pain with inspiration but denies shortness of breath, cough or fever. She also complains of right calf tightness. She had a recent right foot injury but reports the calf tightness was subsequent to her injury. No history of blood clots. She has a history of HTN, hypercholesteremia, DM.  Past Medical History  Diagnosis Date  . Abdominal pain   . Nausea & vomiting   . Diabetes mellitus without complication   . Hyperlipidemia   . Hypertension   . GERD (gastroesophageal reflux disease)   . Asthma   . Arthritis    Past Surgical History  Procedure Laterality Date  . Abdominal hysterectomy    . Appendectomy    . Brain surgery     No family history on file. History  Substance Use Topics  . Smoking status: Never Smoker   . Smokeless tobacco: Not on file  . Alcohol Use: No   OB History   Grav Para Term Preterm Abortions TAB SAB Ect Mult Living                 Review of Systems  Constitutional: Negative for fever.  Respiratory: Negative for cough and shortness of breath.        See HPI.  Cardiovascular: Positive for chest pain. Negative for leg swelling.  Gastrointestinal: Negative for  nausea and abdominal pain.  Musculoskeletal: Positive for back pain.  Skin: Negative for color change.    Allergies  Review of patient's allergies indicates no known allergies.  Home Medications   Current Outpatient Rx  Name  Route  Sig  Dispense  Refill  . aspirin EC 81 MG tablet   Oral   Take 81 mg by mouth daily.         Marland Kitchen ibuprofen (ADVIL,MOTRIN) 200 MG tablet   Oral   Take 400 mg by mouth every 6 (six) hours as needed for pain.         . Liraglutide (VICTOZA) 18 MG/3ML SOLN   Subcutaneous   Inject 1.8 mg into the skin every morning.          . metFORMIN (GLUCOPHAGE) 500 MG tablet   Oral   Take 500 mg by mouth daily with breakfast.         . montelukast (SINGULAIR) 10 MG tablet   Oral   Take 10 mg by mouth at bedtime.         . valsartan-hydrochlorothiazide (DIOVAN-HCT) 160-12.5 MG per tablet   Oral   Take 1 tablet by mouth daily.          BP 128/75  Pulse 74  Temp(Src) 98 F (36.7 C) (Oral)  Resp 20  Wt 215 lb (97.523 kg)  BMI 35.78 kg/m2  SpO2 98% Physical Exam  Constitutional: She is oriented to person, place, and time. She appears well-developed and well-nourished.  HENT:  Head: Normocephalic.  Neck: Normal range of motion. Neck supple.  Cardiovascular: Normal rate, regular rhythm and intact distal pulses.   No murmur heard. Pulmonary/Chest: Effort normal and breath sounds normal.  Abdominal: Soft. Bowel sounds are normal. There is no tenderness. There is no rebound and no guarding.  Musculoskeletal: Normal range of motion.  No calf tenderness in lower extremities. No LE swelling or redness.   Neurological: She is alert and oriented to person, place, and time.  Skin: Skin is warm and dry. No rash noted.  Psychiatric: She has a normal mood and affect.    ED Course  Procedures (including critical care time) Labs Reviewed  CBC  BASIC METABOLIC PANEL  APTT  D-DIMER, QUANTITATIVE  POCT I-STAT TROPONIN I   Results for orders placed  during the hospital encounter of 09/01/12  CBC      Result Value Range   WBC 10.4  4.0 - 10.5 K/uL   RBC 4.53  3.87 - 5.11 MIL/uL   Hemoglobin 12.3  12.0 - 15.0 g/dL   HCT 69.6  29.5 - 28.4 %   MCV 83.7  78.0 - 100.0 fL   MCH 27.2  26.0 - 34.0 pg   MCHC 32.5  30.0 - 36.0 g/dL   RDW 13.2  44.0 - 10.2 %   Platelets 382  150 - 400 K/uL  BASIC METABOLIC PANEL      Result Value Range   Sodium 136  135 - 145 mEq/L   Potassium 4.5  3.5 - 5.1 mEq/L   Chloride 102  96 - 112 mEq/L   CO2 23  19 - 32 mEq/L   Glucose, Bld 86  70 - 99 mg/dL   BUN 10  6 - 23 mg/dL   Creatinine, Ser 7.25  0.50 - 1.10 mg/dL   Calcium 9.1  8.4 - 36.6 mg/dL   GFR calc non Af Amer >90  >90 mL/min   GFR calc Af Amer >90  >90 mL/min  APTT      Result Value Range   aPTT 35  24 - 37 seconds  D-DIMER, QUANTITATIVE      Result Value Range   D-Dimer, Quant 0.48  0.00 - 0.48 ug/mL-FEU  POCT I-STAT TROPONIN I      Result Value Range   Troponin i, poc 0.02  0.00 - 0.08 ng/mL   Comment 3             Date: 09/01/2012  Rate: 69  Rhythm: normal sinus rhythm  QRS Axis: normal  Intervals: normal  ST/T Wave abnormalities: nonspecific T wave changes  Conduction Disutrbances:none  Narrative Interpretation:   Old EKG Reviewed: unchanged   Dg Chest Port 1 View  09/01/2012   *RADIOLOGY REPORT*  Clinical Data: Mid chest pain, shortness of breath, asthma  PORTABLE CHEST - 1 VIEW  Comparison: 07/15/2012  Findings:  Grossly unchanged cardiac silhouette and mediastinal contours.  No focal parenchymal opacities.  No pleural effusion or pneumothorax. No evidence of edema.  Unchanged bones.  IMPRESSION: No acute cardiopulmonary disease.   Original Report Authenticated By: Tacey Ruiz, MD   No diagnosis found. 1. Chest pain 2. History of Reflux  MDM  Pain is improved. Lab studies, EKG, CXR essentially negative. Pain constant for 24 hours with no aggravating or alleviating factors. Doubt ACS. Will refer to cardiology  given risk  factors for consideration of outpatient stress test. She does have history of GERD and is currently taking NSAIDs regularly. Discussed stopping use and GI f/u with Dr. Loreta Ave. Stable for discharge.  Arnoldo Hooker, PA-C 09/01/12 2324

## 2012-09-01 NOTE — ED Notes (Signed)
Pt developed chest pain mid chest and left chest last night, slight back pain, went to urgent care today and was given ? GI cocktail that helped briefly, pain is reproducible with movement and tactile stimuli, no other associated symptoms,

## 2012-09-01 NOTE — ED Provider Notes (Signed)
Medical screening examination/treatment/procedure(s) were performed by non-physician practitioner and as supervising physician I was immediately available for consultation/collaboration.   Gwyneth Sprout, MD 09/01/12 2328

## 2013-06-13 ENCOUNTER — Other Ambulatory Visit: Payer: Self-pay | Admitting: Physician Assistant

## 2013-06-13 ENCOUNTER — Ambulatory Visit
Admission: RE | Admit: 2013-06-13 | Discharge: 2013-06-13 | Disposition: A | Discharge status: Home / Self Care | Payer: BC Managed Care – PPO | Source: Ambulatory Visit | Attending: Physician Assistant | Admitting: Physician Assistant

## 2013-06-13 DIAGNOSIS — R0609 Other forms of dyspnea: Principal | ICD-10-CM

## 2013-07-02 ENCOUNTER — Ambulatory Visit (HOSPITAL_COMMUNITY): Payer: BC Managed Care – PPO | Attending: Cardiovascular Disease | Admitting: Radiology

## 2013-07-02 ENCOUNTER — Encounter: Payer: Self-pay | Admitting: Cardiovascular Disease

## 2013-07-02 VITALS — BP 128/81 | HR 76 | Ht 65.0 in | Wt 222.0 lb

## 2013-07-02 DIAGNOSIS — R0602 Shortness of breath: Secondary | ICD-10-CM | POA: Insufficient documentation

## 2013-07-02 DIAGNOSIS — R0989 Other specified symptoms and signs involving the circulatory and respiratory systems: Secondary | ICD-10-CM | POA: Insufficient documentation

## 2013-07-02 DIAGNOSIS — R079 Chest pain, unspecified: Secondary | ICD-10-CM

## 2013-07-02 DIAGNOSIS — R0609 Other forms of dyspnea: Secondary | ICD-10-CM | POA: Insufficient documentation

## 2013-07-02 DIAGNOSIS — R9431 Abnormal electrocardiogram [ECG] [EKG]: Secondary | ICD-10-CM | POA: Insufficient documentation

## 2013-07-02 MED ORDER — TECHNETIUM TC 99M SESTAMIBI GENERIC - CARDIOLITE
33.0000 | Freq: Once | INTRAVENOUS | Status: AC | PRN
  Administered 2013-07-02: 33 via INTRAVENOUS

## 2013-07-02 NOTE — Progress Notes (Signed)
[facility] SITE 3 NUCLEAR MED [address] [place], [place] [address] [phone]    Cardiology Nuclear Med Study  Kristen Holland is a [age][facility] y.o. female     MRN : [id number]     DOB: 1965/10/08  Procedure Date: 07/02/2013  Nuclear Med Background Indication for Stress Test:  Evaluation for Ischemia and Abnormal EKG History:  No prior known history of CAD Cardiac Risk Factors: Hypertension, Lipids and NIDDM  Symptoms: Chest Pain with/without exertion (last occurrence one week ago), DOE and SOB   Nuclear Pre-Procedure Caffeine/Decaff Intake:  None > 12 hrs NPO After: 4:30am   Lungs:  Clear. O2 Sat: 94% on room air. Albuterol Inhaler 2 sprays prior to test per patient> O2 Sat improved to 98% RA.  IV 0.9% NS with Angio Cath:  22g  IV Site: R Antecubital x 1, tolerated well IV Started by:  Irean HongPatsy Edwards, RN  Chest Size (in):  42 Cup Size: DD  Height: 5\' 5"  (1.651 m)  Weight:  222 lb (100.699 kg)  BMI:  Body mass index is 36.94 kg/(m^2). Tech Comments:  Diovan this am, and metformin last night. Irean HongPatsy Edwards, RN.    Nuclear Med Study 1 or 2 day study: 2 day  Stress Test Type:  Stress  Reading MD: N/A  Order Authorizing Provider:  Blair Heysobert Ehinger, MD  Resting Radionuclide: Technetium 3439m Sestamibi  Resting Radionuclide Dose: 33.0 mCi  07/03/13  Stress Radionuclide:  Technetium 4939m Sestamibi  Stress Radionuclide Dose: 33.0 mCi   07/02/13          Stress Protocol Rest HR: 76 Stress HR: 157  Rest BP: 128/81 Stress BP: 176/74  Exercise Time (min): 5:16 METS: 7.0   Predicted Max HR: 175 bpm % Max HR: 89.71 bpm Rate Pressure Product: [id number]   Dose of Adenosine (mg):  n/a Dose of Lexiscan: n/a mg  Dose of Atropine (mg): n/a Dose of Dobutamine: n/a mcg/kg/min (at max HR)  Stress Test Technologist: Irean HongPatsy Edwards, RN  Nuclear Technologist:  Doyne Keelonya Yount, CNMT     Rest Procedure:  Myocardial perfusion imaging was performed at rest 45 minutes following the intravenous  administration of Technetium 5539m Sestamibi. Rest ECG: NSR, borderline criteria for LVH, nonspecific repolarization abnormalities  Stress Procedure:  The patient exercised on the treadmill utilizing the Bruce Protocol for 5:16 minutes, RPE=15.The patient stopped due to DOE, thighs burning 5/10, and denied any chest pain.  Technetium 3239m Sestamibi was injected at peak exercise and myocardial perfusion imaging was performed after a brief delay. Stress ECG: No significant change from baseline ECG  QPS Raw Data Images:  Normal; no motion artifact; normal heart/lung ratio. Stress Images:  Normal homogeneous uptake in all areas of the myocardium. Rest Images:  Normal homogeneous uptake in all areas of the myocardium. Subtraction (SDS):  No evidence of ischemia. Transient Ischemic Dilatation (Normal <1.22):  0.91 Lung/Heart Ratio (Normal <0.45):  0.22  Quantitative Gated Spect Images QGS EDV:  78 ml QGS ESV:  28 ml  Impression Exercise Capacity:  Fair exercise capacity. BP Response:  Normal blood pressure response. Clinical Symptoms:  No significant symptoms noted. ECG Impression:  No significant ST segment change suggestive of ischemia. Comparison with Prior Nuclear Study: No previous nuclear study performed  Overall Impression:  Normal stress nuclear study.  LV Ejection Fraction: 64%.  LV Wall Motion:  NL LV Function; NL Wall Motion   Thurmon FairMihai Rayvn Rickerson, MD, [facility] Institute[facility]ACC [facility] [phone] office [phone] pager

## 2013-07-03 ENCOUNTER — Ambulatory Visit (HOSPITAL_COMMUNITY): Payer: BC Managed Care – PPO | Attending: Family Medicine

## 2013-07-03 DIAGNOSIS — R0989 Other specified symptoms and signs involving the circulatory and respiratory systems: Secondary | ICD-10-CM

## 2013-07-03 MED ORDER — TECHNETIUM TC 99M SESTAMIBI GENERIC - CARDIOLITE
33.0000 | Freq: Once | INTRAVENOUS | Status: AC | PRN
  Administered 2013-07-03: 33 via INTRAVENOUS

## 2013-09-24 ENCOUNTER — Ambulatory Visit (INDEPENDENT_AMBULATORY_CARE_PROVIDER_SITE_OTHER): Payer: BC Managed Care – PPO | Admitting: Family Medicine

## 2013-09-24 ENCOUNTER — Ambulatory Visit
Admission: RE | Admit: 2013-09-24 | Discharge: 2013-09-24 | Disposition: A | Discharge status: Home / Self Care | Payer: BC Managed Care – PPO | Source: Ambulatory Visit | Attending: Family Medicine | Admitting: Family Medicine

## 2013-09-24 VITALS — BP 132/84 | HR 82 | Temp 97.8°F | Resp 16 | Ht 65.0 in | Wt 223.4 lb

## 2013-09-24 DIAGNOSIS — M25569 Pain in unspecified knee: Secondary | ICD-10-CM

## 2013-09-24 DIAGNOSIS — R1011 Right upper quadrant pain: Secondary | ICD-10-CM

## 2013-09-24 DIAGNOSIS — M25562 Pain in left knee: Secondary | ICD-10-CM

## 2013-09-24 DIAGNOSIS — G8929 Other chronic pain: Secondary | ICD-10-CM

## 2013-09-24 DIAGNOSIS — M545 Low back pain, unspecified: Secondary | ICD-10-CM

## 2013-09-24 LAB — COMPREHENSIVE METABOLIC PANEL
ALT: 28 U/L (ref 0–35)
AST: 16 U/L (ref 0–37)
Albumin: 4.2 g/dL (ref 3.5–5.2)
Alkaline Phosphatase: 50 U/L (ref 39–117)
BUN: 9 mg/dL (ref 6–23)
CO2: 26 mEq/L (ref 19–32)
Calcium: 9 mg/dL (ref 8.4–10.5)
Chloride: 105 mEq/L (ref 96–112)
Creat: 0.8 mg/dL (ref 0.50–1.10)
Glucose, Bld: 97 mg/dL (ref 70–99)
Potassium: 4.1 mEq/L (ref 3.5–5.3)
Sodium: 140 mEq/L (ref 135–145)
Total Bilirubin: 0.4 mg/dL (ref 0.2–1.2)
Total Protein: 6.7 g/dL (ref 6.0–8.3)

## 2013-09-24 LAB — CBC WITH DIFFERENTIAL/PLATELET
Basophils Absolute: 0 10*3/uL (ref 0.0–0.1)
Basophils Relative: 0 % (ref 0–1)
Eosinophils Absolute: 0.2 10*3/uL (ref 0.0–0.7)
Eosinophils Relative: 3 % (ref 0–5)
HCT: 38 % (ref 36.0–46.0)
Hemoglobin: 13.2 g/dL (ref 12.0–15.0)
Lymphocytes Relative: 33 % (ref 12–46)
Lymphs Abs: 2.4 10*3/uL (ref 0.7–4.0)
MCH: 28 pg (ref 26.0–34.0)
MCHC: 34.7 g/dL (ref 30.0–36.0)
MCV: 80.5 fL (ref 78.0–100.0)
Monocytes Absolute: 0.5 10*3/uL (ref 0.1–1.0)
Monocytes Relative: 7 % (ref 3–12)
Neutro Abs: 4.2 10*3/uL (ref 1.7–7.7)
Neutrophils Relative %: 57 % (ref 43–77)
Platelets: 372 10*3/uL (ref 150–400)
RBC: 4.72 MIL/uL (ref 3.87–5.11)
RDW: 13.4 % (ref 11.5–15.5)
WBC: 7.4 10*3/uL (ref 4.0–10.5)

## 2013-09-24 LAB — POCT URINALYSIS DIPSTICK
Bilirubin, UA: NEGATIVE
Blood, UA: NEGATIVE
Glucose, UA: NEGATIVE
Ketones, UA: NEGATIVE
Leukocytes, UA: NEGATIVE
Nitrite, UA: NEGATIVE
Protein, UA: NEGATIVE
Spec Grav, UA: 1.02
Urobilinogen, UA: 0.2
pH, UA: 5.5

## 2013-09-24 NOTE — Progress Notes (Signed)
This chart was scribed for Elvina Sidle, MD by Ardelia Mems, Scribe. This patient was seen in room 5 and the patient's care was started at 9:34 AM.  @[email or web address] @  Patient ID: Kristen Holland MRN: [id number], DOB: Feb 29, 1966, 48 y.o. Date of Encounter: 09/24/2013, 9:35 AM  Primary Physician: Gwynneth Aliment, MD  Chief Complaint: abdominal pain  HPI: 48 y.o. year old female with history below presents with intermittent right sided abdominal pain onset 4 days ago. She states that the pain occasionally radiates to her right lower back and down her right leg. She states that her pain is exacerbated with deep breathing. She rates her pain at 4/10 currently, and states that the pain is worse right after she wakes form sleep. She states that she has been taking Tylenol with some relief. She reports having a history of appendectomy. She denies nausea, emesis, chest pain, SOB, calf tenderness or any other symptoms. She denies any personal or family history of gallbladder disease.  She is also requesting approval for a handicap sticker due to her history of arthritis in her back and right knee, as well as her history of asthma. She denies any numbness or weakness in her legs, but states that her right knee occasionally gives out when she walks up stairs. She states that she sees an Orthopedist, Dr. Melrose Nakayama, and receives injection sin her knee.   Past Medical History  Diagnosis Date  . Abdominal pain   . Nausea & vomiting   . Diabetes mellitus without complication   . Hyperlipidemia   . Hypertension   . GERD (gastroesophageal reflux disease)   . Asthma   . Arthritis   . Allergy      Home Meds: Prior to Admission medications   Medication Sig Start Date End Date Taking? Authorizing Provider  aspirin EC 81 MG tablet Take 81 mg by mouth daily.   Yes Historical Provider, MD  dexlansoprazole (DEXILANT) 60 MG capsule Take 60 mg by mouth daily.   Yes Historical Provider, MD  ibuprofen (ADVIL,MOTRIN) 200 MG  tablet Take 400 mg by mouth every 6 (six) hours as needed for pain.   Yes Historical Provider, MD  Liraglutide (VICTOZA) 18 MG/3ML SOLN Inject 1.8 mg into the skin every morning.    Yes Historical Provider, MD  metFORMIN (GLUCOPHAGE) 500 MG tablet Take 500 mg by mouth daily with breakfast.   Yes Historical Provider, MD  montelukast (SINGULAIR) 10 MG tablet Take 10 mg by mouth at bedtime.   Yes Historical Provider, MD  valsartan-hydrochlorothiazide (DIOVAN-HCT) 160-12.5 MG per tablet Take 1 tablet by mouth daily.   Yes Historical Provider, MD    Allergies: No Known Allergies  History   Social History  . Marital Status: Married    Spouse Name: N/A    Number of Children: N/A  . Years of Education: N/A   Occupational History  . Not on file.   Social History Main Topics  . Smoking status: Never Smoker   . Smokeless tobacco: Not on file  . Alcohol Use: No  . Drug Use: No  . Sexual Activity: Not on file   Other Topics Concern  . Not on file   Social History Narrative  . No narrative on file     Review of Systems: Constitutional: negative for chills, fever, night sweats, weight changes, or fatigue  HEENT: negative for vision changes, hearing loss, congestion, rhinorrhea, ST, epistaxis, or sinus pressure Cardiovascular: negative for chest pain or palpitations Respiratory: negative for hemoptysis, wheezing, shortness of  breath, or cough Abdominal: negative for nausea, vomiting, diarrhea, or constipation, +abdominal pain Dermatological: negative for rash Neurologic: negative for headache, dizziness, or syncope All other systems reviewed and are otherwise negative with the exception to those above and in the HPI.   Physical Exam Blood pressure 132/84, pulse 82, temperature 97.8 F (36.6 C), temperature source Oral, resp. rate 16, height 5\' 5"  (1.651 m), weight 223 lb 6.4 oz (101.334 kg), SpO2 97.00%., Body mass index is 37.18 kg/(m^2). General: Well developed, well nourished, in  no acute distress. Head: Normocephalic, atraumatic, eyes without discharge, sclera non-icteric, nares are without discharge. Bilateral auditory canals clear, TM's are without perforation, pearly grey and translucent with reflective cone of light bilaterally. Oral cavity moist, posterior pharynx without exudate, erythema, peritonsillar abscess, or post nasal drip.  Neck: Supple. No thyromegaly. Full ROM. No lymphadenopathy. Lungs: Clear bilaterally to auscultation without wheezes, rales, or rhonchi. Breathing is unlabored. Heart: RRR with S1 S2. No murmurs, rubs, or gallops appreciated. Abdomen: Tender in right upper quadrant, no rebound or guarding. Soft, non-distended with normoactive bowel sounds. No hepatomegaly. No obvious abdominal masses. Msk:  Strength and tone normal for age. Negative straight leg raise. No knee effusion. Extremities/Skin: Warm and dry. No clubbing or cyanosis. No edema. No rashes or suspicious lesions. Neuro: Alert and oriented X 3. Moves all extremities spontaneously. Gait is normal. CNII-XII grossly in tact. Psych:  Responds to questions appropriately with a normal affect.     ASSESSMENT AND PLAN:  48 y.o. year old female with RUQ abdominal pain - Plan: Comprehensive metabolic panel, CBC with Differential, US Abdomen Limited RUQ  Given a handicap form    Signed, Elvina SidleKurt Keamber Macfadden, MD 09/24/2013 9:35 AM

## 2013-09-24 NOTE — Patient Instructions (Signed)

## 2013-09-27 ENCOUNTER — Telehealth: Payer: Self-pay | Admitting: *Deleted

## 2013-09-27 NOTE — Telephone Encounter (Signed)
Called and spoke with pt in regards to results. She expressed understanding . Wanted to know if something could be prescribed for pain for her like ibuprofen or tramadol? Please advise

## 2013-09-27 NOTE — Telephone Encounter (Signed)
Dr Nanda QuintonLauenstein-you called in/e-prescribed the tramadol under the incorrect [place] Doan.  The right [place]lie Boda has a DOB [id number].  You sent in the medication under [place]lie Tully with a DOB of [birth year].  This discrepancy has been resolved at the pharmacy.  But I wanted to let you know what happened.  We need to cancel the med under the wrong patient and document it was sent in under the correct patient.

## 2013-10-15 ENCOUNTER — Telehealth: Payer: Self-pay

## 2013-10-15 ENCOUNTER — Ambulatory Visit (INDEPENDENT_AMBULATORY_CARE_PROVIDER_SITE_OTHER): Payer: BC Managed Care – PPO | Admitting: Physician Assistant

## 2013-10-15 VITALS — BP 132/80 | HR 90 | Temp 98.2°F | Resp 17 | Ht 65.5 in | Wt 222.0 lb

## 2013-10-15 DIAGNOSIS — Z23 Encounter for immunization: Secondary | ICD-10-CM

## 2013-10-15 NOTE — Telephone Encounter (Signed)
Patient is requesting a verbal on her shot record.[phone]

## 2013-10-15 NOTE — Addendum Note (Signed)
Addended by: Porfirio OarJEFFERY, Amiaya Mcneeley S on: 10/15/2013 03:00 PM   Modules accepted: Orders

## 2013-10-15 NOTE — Progress Notes (Signed)
Subjective:    Patient ID: Kristen Holland, female    DOB: 09/08/1965, 48 y.o.   MRN: [id number]   PCP: Maximino Greenland, MD  Chief Complaint  Patient presents with  . Immunizations    MMR     Prior to Admission medications   Medication Sig Start Date End Date Taking? Authorizing Provider  aspirin EC 81 MG tablet Take 81 mg by mouth daily.   Yes Historical Provider, MD  dexlansoprazole (DEXILANT) 60 MG capsule Take 60 mg by mouth daily.   Yes Historical Provider, MD  ibuprofen (ADVIL,MOTRIN) 200 MG tablet Take 400 mg by mouth every 6 (six) hours as needed for pain.   Yes Historical Provider, MD  Liraglutide (VICTOZA) 18 MG/3ML SOLN Inject 1.8 mg into the skin every morning.    Yes Historical Provider, MD  metFORMIN (GLUCOPHAGE) 500 MG tablet Take 500 mg by mouth daily with breakfast.   Yes Historical Provider, MD  montelukast (SINGULAIR) 10 MG tablet Take 10 mg by mouth at bedtime.   Yes Historical Provider, MD  valsartan-hydrochlorothiazide (DIOVAN-HCT) 160-12.5 MG per tablet Take 1 tablet by mouth daily.   Yes Historical Provider, MD     HPI  Presents for MMR vaccination.  She has some of her records, but there is no documentation of MMR.  She needs 1 now, and a booster in 1 month.  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.  Review of Systems     Objective:   Physical Exam BP 132/80  Pulse 90  Temp(Src) 98.2 F (36.8 C) (Oral)  Resp 17  Ht 5' 5.5" (1.664 m)  Wt 222 lb (100.699 kg)  BMI 36.37 kg/m2  SpO2 99%  WDWNBF, A&O x 3. Normal respiratory effort. Good mood, bright affect. Cooperative and active in the exam room.      Assessment & Plan:  1. Need for MMR vaccine Return in 1 month for booster dose. - MMR vaccine subcutaneous   Fara Chute, PA-C Physician Assistant-Certified [facility]

## 2013-10-22 NOTE — Telephone Encounter (Signed)
Spoke with patient. Says she did not call asking about her shot records. She does not need anything at this time.

## 2013-11-20 ENCOUNTER — Encounter (HOSPITAL_COMMUNITY): Payer: Self-pay | Admitting: Emergency Medicine

## 2013-11-20 ENCOUNTER — Emergency Department (HOSPITAL_COMMUNITY)
Admission: EM | Admit: 2013-11-20 | Discharge: 2013-11-21 | Disposition: A | Discharge status: Home / Self Care | Payer: BC Managed Care – PPO | Attending: Emergency Medicine | Admitting: Emergency Medicine

## 2013-11-20 ENCOUNTER — Emergency Department (HOSPITAL_COMMUNITY): Payer: BC Managed Care – PPO

## 2013-11-20 DIAGNOSIS — E119 Type 2 diabetes mellitus without complications: Secondary | ICD-10-CM | POA: Insufficient documentation

## 2013-11-20 DIAGNOSIS — J069 Acute upper respiratory infection, unspecified: Secondary | ICD-10-CM | POA: Diagnosis not present

## 2013-11-20 DIAGNOSIS — M129 Arthropathy, unspecified: Secondary | ICD-10-CM | POA: Insufficient documentation

## 2013-11-20 DIAGNOSIS — I10 Essential (primary) hypertension: Secondary | ICD-10-CM | POA: Insufficient documentation

## 2013-11-20 DIAGNOSIS — Z79899 Other long term (current) drug therapy: Secondary | ICD-10-CM | POA: Insufficient documentation

## 2013-11-20 DIAGNOSIS — K219 Gastro-esophageal reflux disease without esophagitis: Secondary | ICD-10-CM | POA: Diagnosis not present

## 2013-11-20 DIAGNOSIS — Z7982 Long term (current) use of aspirin: Secondary | ICD-10-CM | POA: Diagnosis not present

## 2013-11-20 DIAGNOSIS — R079 Chest pain, unspecified: Secondary | ICD-10-CM | POA: Insufficient documentation

## 2013-11-20 DIAGNOSIS — J45909 Unspecified asthma, uncomplicated: Secondary | ICD-10-CM | POA: Diagnosis not present

## 2013-11-20 LAB — CBC
HCT: 40.5 % (ref 36.0–46.0)
Hemoglobin: 13.7 g/dL (ref 12.0–15.0)
MCH: 27.6 pg (ref 26.0–34.0)
MCHC: 33.8 g/dL (ref 30.0–36.0)
MCV: 81.7 fL (ref 78.0–100.0)
PLATELETS: 363 10*3/uL (ref 150–400)
RBC: 4.96 MIL/uL (ref 3.87–5.11)
RDW: 12.8 % (ref 11.5–15.5)
WBC: 12.2 10*3/uL — ABNORMAL HIGH (ref 4.0–10.5)

## 2013-11-20 LAB — COMPREHENSIVE METABOLIC PANEL
ALT: 26 U/L (ref 0–35)
AST: 18 U/L (ref 0–37)
Albumin: 4.1 g/dL (ref 3.5–5.2)
Alkaline Phosphatase: 74 U/L (ref 39–117)
Anion gap: 14 (ref 5–15)
BUN: 8 mg/dL (ref 6–23)
CALCIUM: 9.9 mg/dL (ref 8.4–10.5)
CO2: 23 mEq/L (ref 19–32)
CREATININE: 0.9 mg/dL (ref 0.50–1.10)
Chloride: 103 mEq/L (ref 96–112)
GFR, EST AFRICAN AMERICAN: 88 mL/min — AB (ref >90)
GFR, EST NON AFRICAN AMERICAN: 76 mL/min — AB (ref >90)
Glucose, Bld: 108 mg/dL — ABNORMAL HIGH (ref 70–99)
Potassium: 4 mEq/L (ref 3.7–5.3)
SODIUM: 140 meq/L (ref 137–147)
TOTAL PROTEIN: 7.9 g/dL (ref 6.0–8.3)
Total Bilirubin: 0.5 mg/dL (ref 0.3–1.2)

## 2013-11-20 LAB — I-STAT TROPONIN, ED: TROPONIN I, POC: 0 ng/mL (ref 0.00–0.08)

## 2013-11-20 MED ORDER — ALBUTEROL SULFATE (2.5 MG/3ML) 0.083% IN NEBU
2.5000 mg | INHALATION_SOLUTION | Freq: Once | RESPIRATORY_TRACT | Status: AC
  Administered 2013-11-21: 2.5 mg via RESPIRATORY_TRACT
  Filled 2013-11-20: qty 3

## 2013-11-20 MED ORDER — GI COCKTAIL ~~LOC~~
30.0000 mL | Freq: Once | ORAL | Status: AC
  Administered 2013-11-20: 30 mL via ORAL
  Filled 2013-11-20: qty 30

## 2013-11-20 NOTE — ED Notes (Signed)
Pt unable to void at this time. 

## 2013-11-20 NOTE — ED Provider Notes (Signed)
CSN: [id number]     Arrival date & time 11/20/13  2132 History   First MD Initiated Contact with Patient 11/20/13 2142     Chief Complaint  Patient presents with  . Chest Pain     (Consider location/radiation/quality/duration/timing/severity/associated sxs/prior Treatment) HPI Comments: Patient is a 48 year old female past no history of hypertension, diabetes, reflux presenting to emergency room chief complaint of chest discomfort since 4 PM today and sore throat for one day. The patient reports gradual onset of central chest discomfort, described as tightness, non radiating, constant. Chills reports associated shortness of breath with productive cough, stating similar to previous asthma episodes. She reports taking albuterol inhaler at approximately 3 PM. She reports nasal congestion, sore throat, ear pain since this morning.  Reports worsening sore throat with swallowing.   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  Reports taking Mucinex DM, singular without full resolution of symptoms. PCP: Gwynneth Aliment, MD  Patient is a 48 y.o. female presenting with chest pain. The history is provided by the patient. No language interpreter was used.  Chest Pain Associated symptoms: cough and shortness of breath   Associated symptoms: no fever     Past Medical History  Diagnosis Date  . Abdominal pain   . Nausea & vomiting   . Diabetes mellitus without complication   . Hyperlipidemia   . Hypertension   . GERD (gastroesophageal reflux disease)   . Asthma   . Arthritis   . Allergy    Past Surgical History  Procedure Laterality Date  . Abdominal hysterectomy    . Appendectomy    . Brain surgery     No family history on file. History  Substance Use Topics  . Smoking status: Never Smoker   . Smokeless tobacco: Not on file  . Alcohol Use: No   OB History   Grav Para Term Preterm Abortions TAB SAB Ect Mult Living                 Review of Systems   Constitutional: Negative for fever and chills.  HENT: Positive for congestion, ear pain, postnasal drip and sore throat. Negative for ear discharge.   Respiratory: Positive for cough, chest tightness and shortness of breath. Negative for wheezing.   Cardiovascular: Positive for chest pain. Negative for leg swelling.      Allergies  Review of patient's allergies indicates no known allergies.  Home Medications   Prior to Admission medications   Medication Sig Start Date End Date Taking? Authorizing Provider  aspirin EC 81 MG tablet Take 81 mg by mouth daily.    Historical Provider, MD  dexlansoprazole (DEXILANT) 60 MG capsule Take 60 mg by mouth daily.    Historical Provider, MD  ibuprofen (ADVIL,MOTRIN) 200 MG tablet Take 400 mg by mouth every 6 (six) hours as needed for pain.    Historical Provider, MD  Liraglutide (VICTOZA) 18 MG/3ML SOLN Inject 1.8 mg into the skin every morning.     Historical Provider, MD  metFORMIN (GLUCOPHAGE) 500 MG tablet Take 500 mg by mouth daily with breakfast.    Historical Provider, MD  montelukast (SINGULAIR) 10 MG tablet Take 10 mg by mouth at bedtime.    Historical Provider, MD  valsartan-hydrochlorothiazide (DIOVAN-HCT) 160-12.5 MG per tablet Take 1 tablet by mouth daily.    Historical Provider, MD   BP 111/73  Pulse 95  Temp(Src) 97.6 F (36.4 C) (Oral)  Resp 19  SpO2 100% Physical Exam  Nursing  note and vitals reviewed. Constitutional: She appears well-developed and well-nourished.  HENT:  Head: Normocephalic and atraumatic.  Right Ear: Tympanic membrane and external ear normal. Tympanic membrane is not retracted and not bulging.  Left Ear: Tympanic membrane and external ear normal. Tympanic membrane is not retracted and not bulging.  Nose: Rhinorrhea present.  Mouth/Throat: Uvula is midline. Posterior oropharyngeal erythema present. No oropharyngeal exudate or tonsillar abscesses.  Pulmonary/Chest: She has no wheezes. She has no rales. She  exhibits no tenderness.  Abdominal: Soft.  Skin: Skin is warm and dry.    ED Course  Procedures (including critical care time) Labs Review Labs Reviewed  CBC - Abnormal; Notable for the following:    WBC 12.2 (*)    All other components within normal limits  COMPREHENSIVE METABOLIC PANEL - Abnormal; Notable for the following:    Glucose, Bld 108 (*)    GFR calc non Af Amer 76 (*)    GFR calc Af Amer 88 (*)    All other components within normal limits  URINALYSIS, ROUTINE W REFLEX MICROSCOPIC  I-STAT TROPOININ, ED    Imaging Review Dg Chest 2 View  11/20/2013   CLINICAL DATA:  Chest pain and congestion.  Cough.  EXAM: CHEST  2 VIEW  COMPARISON:  Chest radiograph performed 06/13/2013  FINDINGS: The lungs are well-aerated and clear. There is no evidence of focal opacification, pleural effusion or pneumothorax.  The heart is normal in size; the mediastinal contour is within normal limits. No acute osseous abnormalities are seen.  IMPRESSION: No acute cardiopulmonary process seen.   Electronically Signed   By: Roanna Raider M.D.   On: 11/20/2013 23:51     EKG Interpretation   Date/Time:  Wednesday November 20 2013 21:38:58 EDT Ventricular Rate:  91 PR Interval:  156 QRS Duration: 83 QT Interval:  349 QTC Calculation: 429 R Axis:   63 Text Interpretation:  Sinus rhythm Borderline T abnormalities lateral  leads, new compares to prior tracing Confirmed by KNAPP  MD-J, JON ([id number])  on 11/20/2013 9:57:24 PM      MDM   Final diagnoses:  URI, acute   Patient presents with chest discomfort and URI symptoms, pulse ox 100% on room air, HR 90's. Patients symptoms likely viral. Chest pain likely do to viral illness, history does not sound cardiac in nature, EMR shows negative stress test 06/2013, EKG shows borderline T. wave abnormalities, negative 5 hour and 52 minute troponin, will not repeat. PERC negative.  Reevaluation patient resting comfortably in room she reports moderate  resolution of symptoms with GI cocktail, SpO2 100% on room air, pulse 80 speaking in complete sentences.  0150Reevaluation patient reports significant improvement after breathing treatment requesting to go home. Discussed lab results, imaging results, and treatment plan with the patient. Return precautions given. Reports understanding and no other concerns at this time.  Patient is stable for discharge at this time.  Meds given in ED:  Medications  gi cocktail (Maalox,Lidocaine,Donnatal) (30 mLs Oral Given 11/20/13 2320)  albuterol (PROVENTIL) (2.5 MG/3ML) 0.083% nebulizer solution 2.5 mg (2.5 mg Nebulization Given 11/21/13 0014)    Discharge Medication List as of 11/21/2013  1:47 AM    START taking these medications   Details  !! albuterol (PROVENTIL HFA;VENTOLIN HFA) 108 (90 BASE) MCG/ACT inhaler Inhale 1-2 puffs into the lungs every 6 (six) hours as needed for wheezing or shortness of breath., Starting 11/21/2013, Until Discontinued, Print    benzonatate (TESSALON) 100 MG capsule Take 1 capsule (100 mg  total) by mouth every 8 (eight) hours., Starting 11/21/2013, Until Discontinued, Print    dextromethorphan-guaiFENesin (MUCINEX DM) 30-600 MG per 12 hr tablet Take 1 tablet by mouth 2 (two) times daily., Starting 11/21/2013, Until Discontinued, Print    HYDROcodone-acetaminophen (HYCET) 7.5-325 mg/15 ml solution Take 15 mLs by mouth at bedtime as needed for moderate pain or severe pain., Starting 11/21/2013, Until Discontinued, Print     !! - Potential duplicate medications found. Please discuss with provider.           Mellody Drown, PA-C 11/21/13 1550

## 2013-11-20 NOTE — ED Notes (Signed)
Pt came via pov to ED to chest pain. generlized mid chest pain. Pt states it feels like her asthma. Pt states she was driving when pain occurred. Pt c/o sob and n/vx 1 and diarrhea x 1.

## 2013-11-21 DIAGNOSIS — J069 Acute upper respiratory infection, unspecified: Secondary | ICD-10-CM | POA: Diagnosis not present

## 2013-11-21 LAB — URINALYSIS, ROUTINE W REFLEX MICROSCOPIC
BILIRUBIN URINE: NEGATIVE
Glucose, UA: NEGATIVE mg/dL
Hgb urine dipstick: NEGATIVE
KETONES UR: NEGATIVE mg/dL
LEUKOCYTES UA: NEGATIVE
NITRITE: NEGATIVE
PH: 6 (ref 5.0–8.0)
Protein, ur: NEGATIVE mg/dL
SPECIFIC GRAVITY, URINE: 1.018 (ref 1.005–1.030)
UROBILINOGEN UA: 1 mg/dL (ref 0.0–1.0)

## 2013-11-21 MED ORDER — HYDROCODONE-ACETAMINOPHEN 7.5-325 MG/15ML PO SOLN: 15.0000 mL | Freq: Every evening | ORAL | Status: DC | PRN

## 2013-11-21 MED ORDER — DM-GUAIFENESIN ER 30-600 MG PO TB12: 1.0000 | ORAL_TABLET | Freq: Two times a day (BID) | ORAL | Status: DC

## 2013-11-21 MED ORDER — BENZONATATE 100 MG PO CAPS: 100.0000 mg | ORAL_CAPSULE | Freq: Three times a day (TID) | ORAL | Status: DC

## 2013-11-21 MED ORDER — ALBUTEROL SULFATE HFA 108 (90 BASE) MCG/ACT IN AERS
1.0000 | INHALATION_SPRAY | Freq: Four times a day (QID) | RESPIRATORY_TRACT | Status: AC | PRN
Start: 2013-11-21 — End: ?

## 2013-11-21 NOTE — ED Notes (Signed)
No urine collected as of yet, clicked off in error.   Notified nurse

## 2013-11-21 NOTE — Discharge Instructions (Signed)
Call for a follow up appointment with a Family or Primary Care Provider.  Return if Symptoms worsen.   Take medication as prescribed.  Drink plenty of fluids. Do not drink alcohol or operate heavy machinery while taking narcotic medication (Hycet).  Salt water gargles 3-4 times a day.

## 2013-11-21 NOTE — ED Provider Notes (Signed)
Medical screening examination/treatment/procedure(s) were performed by non-physician practitioner and as supervising physician I was immediately available for consultation/collaboration.   EKG Interpretation   Date/Time:  Wednesday November 20 2013 21:38:58 EDT Ventricular Rate:  91 PR Interval:  156 QRS Duration: 83 QT Interval:  349 QTC Calculation: 429 R Axis:   63 Text Interpretation:  Sinus rhythm Borderline T abnormalities lateral  leads, new compares to prior tracing Confirmed by Shirlette Scarber  MD-J, Ayeden Gladman ([id number])  on 11/20/2013 9:57:24 PM        Linwood Dibbles, MD 11/21/13 1600

## 2014-03-10 ENCOUNTER — Other Ambulatory Visit: Payer: Self-pay | Admitting: Physician Assistant

## 2014-03-10 DIAGNOSIS — R1084 Generalized abdominal pain: Secondary | ICD-10-CM

## 2014-03-12 ENCOUNTER — Ambulatory Visit
Admission: RE | Admit: 2014-03-12 | Discharge: 2014-03-12 | Disposition: A | Discharge status: Home / Self Care | Payer: BC Managed Care – PPO | Source: Ambulatory Visit | Attending: Physician Assistant | Admitting: Physician Assistant

## 2014-03-12 DIAGNOSIS — R1084 Generalized abdominal pain: Secondary | ICD-10-CM

## 2014-03-13 ENCOUNTER — Other Ambulatory Visit: Payer: Self-pay | Admitting: Gastroenterology

## 2014-03-13 DIAGNOSIS — R110 Nausea: Secondary | ICD-10-CM

## 2014-03-13 DIAGNOSIS — R1011 Right upper quadrant pain: Secondary | ICD-10-CM

## 2014-03-18 ENCOUNTER — Ambulatory Visit (HOSPITAL_COMMUNITY)
Admission: RE | Admit: 2014-03-18 | Discharge: 2014-03-18 | Disposition: A | Discharge status: Home / Self Care | Payer: BC Managed Care – PPO | Source: Ambulatory Visit | Attending: Gastroenterology | Admitting: Gastroenterology

## 2014-03-18 DIAGNOSIS — R110 Nausea: Secondary | ICD-10-CM | POA: Insufficient documentation

## 2014-03-18 DIAGNOSIS — R1011 Right upper quadrant pain: Secondary | ICD-10-CM | POA: Diagnosis present

## 2014-03-18 MED ORDER — SINCALIDE 5 MCG IJ SOLR
INTRAMUSCULAR | Status: AC
  Administered 2014-03-18: 2.04 ug via INTRAVENOUS
  Filled 2014-03-18: qty 5

## 2014-03-18 MED ORDER — TECHNETIUM TC 99M MEBROFENIN IV KIT
5.0000 | PACK | Freq: Once | INTRAVENOUS | Status: AC | PRN
  Administered 2014-03-18: 5 via INTRAVENOUS

## 2014-03-18 MED ORDER — SINCALIDE 5 MCG IJ SOLR
0.0200 ug/kg | Freq: Once | INTRAMUSCULAR | Status: AC
  Administered 2014-03-18: 2.04 ug via INTRAVENOUS

## 2014-03-26 ENCOUNTER — Ambulatory Visit (INDEPENDENT_AMBULATORY_CARE_PROVIDER_SITE_OTHER): Payer: BC Managed Care – PPO | Admitting: Physician Assistant

## 2014-03-26 VITALS — BP 120/84 | HR 81 | Temp 98.1°F | Resp 18 | Ht 65.0 in | Wt 222.8 lb

## 2014-03-26 DIAGNOSIS — R0981 Nasal congestion: Secondary | ICD-10-CM

## 2014-03-26 DIAGNOSIS — R059 Cough, unspecified: Secondary | ICD-10-CM

## 2014-03-26 DIAGNOSIS — R05 Cough: Secondary | ICD-10-CM

## 2014-03-26 DIAGNOSIS — R070 Pain in throat: Secondary | ICD-10-CM

## 2014-03-26 MED ORDER — MAGIC MOUTHWASH W/LIDOCAINE: 5.0000 mL | Freq: Four times a day (QID) | ORAL | Status: DC | PRN

## 2014-03-26 MED ORDER — ALBUTEROL SULFATE (2.5 MG/3ML) 0.083% IN NEBU
2.5000 mg | INHALATION_SOLUTION | Freq: Once | RESPIRATORY_TRACT | Status: AC
  Administered 2014-03-26: 2.5 mg via RESPIRATORY_TRACT

## 2014-03-26 MED ORDER — GUAIFENESIN ER 1200 MG PO TB12: 1.0000 | ORAL_TABLET | Freq: Two times a day (BID) | ORAL | Status: DC | PRN

## 2014-03-26 NOTE — Progress Notes (Signed)
MRN: [id number] DOB: [birth year]  Subjective:   Chief Complaint  Patient presents with  . Sore Throat    X Saturday  . Shortness of Breath  . Sinus Congestion    X Saturday, draining and really irritating throat    Kristen Holland is a 49 y.o. female presenting for sore throat, sob, and sinus congestion for the lat 5 days.  Patient states that she has had this sore throat without any fever or chills.  Throat is scratchy and irritated.  She also endorses some hoarseness.  She coughs very little and it is non-productive.  She denies ear fullness.  She does have some headache near at her frontal sinus, but no purulent mucus.  She is compliant on her PPI, and states that she does not have any abnormal heartburn unless she ingests certain drinks such as lemonade.  She also complains of SOB from walking and some chest tightness.  This has been present before the 5 days.  It is not associated with chest pain, palpitations, dyspnea, nausea/vomiting, or diaphoresis.    Beena has a current medication list which includes the following prescription(s): albuterol, albuterol, aspirin ec, cholecalciferol, dexlansoprazole, fluoxetine, liraglutide, metformin, montelukast, and valsartan-hydrochlorothiazide.  She has No Known Allergies.  [name]  has a past medical history of Abdominal pain; Nausea & vomiting; Diabetes mellitus without complication; Hyperlipidemia; Hypertension; GERD (gastroesophageal reflux disease); Asthma; Arthritis; and Allergy. Also  has past surgical history that includes Abdominal hysterectomy; Appendectomy; and Brain surgery.  ROS As in subjective.  Objective:   Vitals: BP 120/84 mmHg  Pulse 81  Temp(Src) 98.1 F (36.7 C) (Oral)  Resp 18  Ht  (1.651 m)  Wt 222 lb 12.8 oz (101.061 kg)  BMI 37.08 kg/m2  SpO2 96%  Physical Exam  Constitutional: She is well-developed, well-nourished, and in no distress. No distress.  HENT:  Head: Normocephalic and atraumatic.  Right  Ear: Tympanic membrane, external ear and ear canal normal.  Left Ear: Tympanic membrane, external ear and ear canal normal.  Nose: Nose normal. No mucosal edema or rhinorrhea. Right sinus exhibits no maxillary sinus tenderness and no frontal sinus tenderness. Left sinus exhibits no maxillary sinus tenderness and no frontal sinus tenderness.  Mouth/Throat: Oropharynx is clear and moist. No trismus in the jaw. No uvula swelling. No oropharyngeal exudate or posterior oropharyngeal edema.  Eyes: Conjunctivae are normal. Pupils are equal, round, and reactive to light. Right eye exhibits no discharge. Left eye exhibits no discharge.  Neck: No tracheal deviation present. No thyromegaly present.  Cardiovascular: Normal rate, regular rhythm and normal heart sounds.   Pulmonary/Chest: Effort normal. No accessory muscle usage. No apnea. No respiratory distress. She has decreased breath sounds. She has no wheezes. She has no rhonchi.  Lymphadenopathy:    She has no cervical adenopathy.  Skin: Skin is warm and dry. She is not diaphoretic.    Pre-nebulizer Peak Flow: 270 Post-nebulizer Peak flow: 320  Predicted peak flow: 474    Assessment and Plan :  49 year old female with PMH of allergies, asthma, GERD, HTN is here today for nasal congestion, sore throat, and sob.  This appears an upper respiratory infection of viral etiology, compounded with mistreated asthma.  Advised to use QVAR every day.  Patient agreed.  Treating viral supportively.  Nasal congestion - Plan: Guaifenesin (MUCINEX MAXIMUM STRENGTH) 1200 MG TB12, albuterol (PROVENTIL) (2.5 MG/3ML) 0.083% nebulizer solution 2.5 mg  Cough - Plan: albuterol (PROVENTIL) (2.5 MG/3ML) 0.083% nebulizer solution 2.5 mg  Throat pain - Plan: Alum & Mag Hydroxide-Simeth (MAGIC MOUTHWASH W/LIDOCAINE) SOLN  Trena PlattStephanie English, PA-C [facility] 1/22/201612:03 AM

## 2014-03-26 NOTE — Patient Instructions (Addendum)
Please use your QVAR daily as prescribed.  This is your preventative medication that should prevent the shortness of breath and help your air circulation so that you are able to clear out infection.   The albuterol is used as a rescue inhaler, in emergency and/or after the QVAR is not working.  By taking your QVAR your primary care provider can monitor how your asthma is doing.  Please drink plenty of water. Take the mucinex as prescribed.   Upper Respiratory Infection, Adult An upper respiratory infection (URI) is also sometimes known as the common cold. The upper respiratory tract includes the nose, sinuses, throat, trachea, and bronchi. Bronchi are the airways leading to the lungs. Most people improve within 1 week, but symptoms can last up to 2 weeks. A residual cough may last even longer.  CAUSES Many different viruses can infect the tissues lining the upper respiratory tract. The tissues become irritated and inflamed and often become very moist. Mucus production is also common. A cold is contagious. You can easily spread the virus to others by oral contact. This includes kissing, sharing a glass, coughing, or sneezing. Touching your mouth or nose and then touching a surface, which is then touched by another person, can also spread the virus. SYMPTOMS  Symptoms typically develop 1 to 3 days after you come in contact with a cold virus. Symptoms vary from person to person. They may include:  Runny nose.  Sneezing.  Nasal congestion.  Sinus irritation.  Sore throat.  Loss of voice (laryngitis).  Cough.  Fatigue.  Muscle aches.  Loss of appetite.  Headache.  Low-grade fever. DIAGNOSIS  You might diagnose your own cold based on familiar symptoms, since most people get a cold 2 to 3 times a year. Your caregiver can confirm this based on your exam. Most importantly, your caregiver can check that your symptoms are not due to another disease such as strep throat, sinusitis, pneumonia,  asthma, or epiglottitis. Blood tests, throat tests, and X-rays are not necessary to diagnose a common cold, but they may sometimes be helpful in excluding other more serious diseases. Your caregiver will decide if any further tests are required. RISKS AND COMPLICATIONS  You may be at risk for a more severe case of the common cold if you smoke cigarettes, have chronic heart disease (such as heart failure) or lung disease (such as asthma), or if you have a weakened immune system. The very young and very old are also at risk for more serious infections. Bacterial sinusitis, middle ear infections, and bacterial pneumonia can complicate the common cold. The common cold can worsen asthma and chronic obstructive pulmonary disease (COPD). Sometimes, these complications can require emergency medical care and may be life-threatening. PREVENTION  The best way to protect against getting a cold is to practice good hygiene. Avoid oral or hand contact with people with cold symptoms. Wash your hands often if contact occurs. There is no clear evidence that vitamin C, vitamin E, echinacea, or exercise reduces the chance of developing a cold. However, it is always recommended to get plenty of rest and practice good nutrition. TREATMENT  Treatment is directed at relieving symptoms. There is no cure. Antibiotics are not effective, because the infection is caused by a virus, not by bacteria. Treatment may include:  Increased fluid intake. Sports drinks offer valuable electrolytes, sugars, and fluids.  Breathing heated mist or steam (vaporizer or shower).  Eating chicken soup or other clear broths, and maintaining good nutrition.  Getting plenty  of rest.  Using gargles or lozenges for comfort.  Controlling fevers with ibuprofen or acetaminophen as directed by your caregiver.  Increasing usage of your inhaler if you have asthma. Zinc gel and zinc lozenges, taken in the first 24 hours of the common cold, can shorten the  duration and lessen the severity of symptoms. Pain medicines may help with fever, muscle aches, and throat pain. A variety of non-prescription medicines are available to treat congestion and runny nose. Your caregiver can make recommendations and may suggest nasal or lung inhalers for other symptoms.  HOME CARE INSTRUCTIONS   Only take over-the-counter or prescription medicines for pain, discomfort, or fever as directed by your caregiver.  Use a warm mist humidifier or inhale steam from a shower to increase air moisture. This may keep secretions moist and make it easier to breathe.  Drink enough water and fluids to keep your urine clear or pale yellow.  Rest as needed.  Return to work when your temperature has returned to normal or as your caregiver advises. You may need to stay home longer to avoid infecting others. You can also use a face mask and careful hand washing to prevent spread of the virus. SEEK MEDICAL CARE IF:   After the first few days, you feel you are getting worse rather than better.  You need your caregiver's advice about medicines to control symptoms.  You develop chills, worsening shortness of breath, or brown or red sputum. These may be signs of pneumonia.  You develop yellow or brown nasal discharge or pain in the face, especially when you bend forward. These may be signs of sinusitis.  You develop a fever, swollen neck glands, pain with swallowing, or white areas in the back of your throat. These may be signs of strep throat. SEEK IMMEDIATE MEDICAL CARE IF:   You have a fever.  You develop severe or persistent headache, ear pain, sinus pain, or chest pain.  You develop wheezing, a prolonged cough, cough up blood, or have a change in your usual mucus (if you have chronic lung disease).  You develop sore muscles or a stiff neck. Document Released: 08/17/2000 Document Revised: 05/16/2011 Document Reviewed: 05/29/2013 [facility] Patient Information 2015 [employer]  [place]. This information is not intended to replace advice given to you by your health care provider. Make sure you discuss any questions you have with your health care provider.

## 2014-03-27 ENCOUNTER — Encounter: Payer: Self-pay | Admitting: Physician Assistant

## 2014-09-07 ENCOUNTER — Emergency Department (HOSPITAL_COMMUNITY)
Admission: EM | Admit: 2014-09-07 | Discharge: 2014-09-07 | Disposition: A | Discharge status: Home / Self Care | Payer: BC Managed Care – PPO | Attending: Emergency Medicine | Admitting: Emergency Medicine

## 2014-09-07 ENCOUNTER — Encounter (HOSPITAL_COMMUNITY): Payer: Self-pay

## 2014-09-07 ENCOUNTER — Emergency Department (HOSPITAL_COMMUNITY): Payer: BC Managed Care – PPO

## 2014-09-07 DIAGNOSIS — I10 Essential (primary) hypertension: Secondary | ICD-10-CM | POA: Insufficient documentation

## 2014-09-07 DIAGNOSIS — J45901 Unspecified asthma with (acute) exacerbation: Secondary | ICD-10-CM | POA: Diagnosis not present

## 2014-09-07 DIAGNOSIS — M1990 Unspecified osteoarthritis, unspecified site: Secondary | ICD-10-CM | POA: Insufficient documentation

## 2014-09-07 DIAGNOSIS — Z8719 Personal history of other diseases of the digestive system: Secondary | ICD-10-CM | POA: Diagnosis not present

## 2014-09-07 DIAGNOSIS — K219 Gastro-esophageal reflux disease without esophagitis: Secondary | ICD-10-CM | POA: Diagnosis not present

## 2014-09-07 DIAGNOSIS — E785 Hyperlipidemia, unspecified: Secondary | ICD-10-CM | POA: Diagnosis not present

## 2014-09-07 DIAGNOSIS — R0602 Shortness of breath: Secondary | ICD-10-CM | POA: Diagnosis present

## 2014-09-07 DIAGNOSIS — E119 Type 2 diabetes mellitus without complications: Secondary | ICD-10-CM | POA: Diagnosis not present

## 2014-09-07 DIAGNOSIS — Z79899 Other long term (current) drug therapy: Secondary | ICD-10-CM | POA: Insufficient documentation

## 2014-09-07 DIAGNOSIS — Z7982 Long term (current) use of aspirin: Secondary | ICD-10-CM | POA: Insufficient documentation

## 2014-09-07 MED ORDER — ALBUTEROL SULFATE (2.5 MG/3ML) 0.083% IN NEBU
5.0000 mg | INHALATION_SOLUTION | Freq: Once | RESPIRATORY_TRACT | Status: AC
  Administered 2014-09-07: 5 mg via RESPIRATORY_TRACT
  Filled 2014-09-07: qty 6

## 2014-09-07 MED ORDER — HYDROCOD POLST-CPM POLST ER 10-8 MG/5ML PO SUER: 5.0000 mL | Freq: Two times a day (BID) | ORAL | Status: DC | PRN

## 2014-09-07 MED ORDER — PREDNISONE 10 MG PO TABS: 20.0000 mg | ORAL_TABLET | Freq: Two times a day (BID) | ORAL | Status: DC

## 2014-09-07 NOTE — ED Notes (Signed)
RT called and made aware of neb tx and plans to administered.

## 2014-09-07 NOTE — Discharge Instructions (Signed)
Prednisone as prescribed.  Continue your albuterol inhaler as before as needed for wheezing.   Asthma Asthma is a recurring condition in which the airways tighten and narrow. Asthma can make it difficult to breathe. It can cause coughing, wheezing, and shortness of breath. Asthma episodes, also called asthma attacks, range from minor to life-threatening. Asthma cannot be cured, but medicines and lifestyle changes can help control it. CAUSES Asthma is believed to be caused by inherited (genetic) and environmental factors, but its exact cause is unknown. Asthma may be triggered by allergens, lung infections, or irritants in the air. Asthma triggers are different for each person. Common triggers include:   Animal dander.  Dust mites.  Cockroaches.  Pollen from trees or grass.  Mold.  Smoke.  Air pollutants such as dust, household cleaners, hair sprays, aerosol sprays, paint fumes, strong chemicals, or strong odors.  Cold air, weather changes, and winds (which increase molds and pollens in the air).  Strong emotional expressions such as crying or laughing hard.  Stress.  Certain medicines (such as aspirin) or types of drugs (such as beta-blockers).  Sulfites in foods and drinks. Foods and drinks that may contain sulfites include dried fruit, potato chips, and sparkling grape juice.  Infections or inflammatory conditions such as the flu, a cold, or an inflammation of the nasal membranes (rhinitis).  Gastroesophageal reflux disease (GERD).  Exercise or strenuous activity. SYMPTOMS Symptoms may occur immediately after asthma is triggered or many hours later. Symptoms include:  Wheezing.  Excessive nighttime or early morning coughing.  Frequent or severe coughing with a common cold.  Chest tightness.  Shortness of breath. DIAGNOSIS  The diagnosis of asthma is made by a review of your medical history and a physical exam. Tests may also be performed. These may  include:  Lung function studies. These tests show how much air you breathe in and out.  Allergy tests.  Imaging tests such as X-rays. TREATMENT  Asthma cannot be cured, but it can usually be controlled. Treatment involves identifying and avoiding your asthma triggers. It also involves medicines. There are 2 classes of medicine used for asthma treatment:   Controller medicines. These prevent asthma symptoms from occurring. They are usually taken every day.  Reliever or rescue medicines. These quickly relieve asthma symptoms. They are used as needed and provide short-term relief. Your health care provider will help you create an asthma action plan. An asthma action plan is a written plan for managing and treating your asthma attacks. It includes a list of your asthma triggers and how they may be avoided. It also includes information on when medicines should be taken and when their dosage should be changed. An action plan may also involve the use of a device called a peak flow meter. A peak flow meter measures how well the lungs are working. It helps you monitor your condition. HOME CARE INSTRUCTIONS   Take medicines only as directed by your health care provider. Speak with your health care provider if you have questions about how or when to take the medicines.  Use a peak flow meter as directed by your health care provider. Record and keep track of readings.  Understand and use the action plan to help minimize or stop an asthma attack without needing to seek medical care.  Control your home environment in the following ways to help prevent asthma attacks:  Do not smoke. Avoid being exposed to secondhand smoke.  Change your heating and air conditioning filter regularly.  Limit  your use of fireplaces and wood stoves.  Get rid of pests (such as roaches and mice) and their droppings.  Throw away plants if you see mold on them.  Clean your floors and dust regularly. Use unscented cleaning  products.  Try to have someone else vacuum for you regularly. Stay out of rooms while they are being vacuumed and for a short while afterward. If you vacuum, use a dust mask from a hardware store, a double-layered or microfilter vacuum cleaner bag, or a vacuum cleaner with a HEPA filter.  Replace carpet with wood, tile, or vinyl flooring. Carpet can trap dander and dust.  Use allergy-proof pillows, mattress covers, and box spring covers.  Wash bed sheets and blankets every week in hot water and dry them in a dryer.  Use blankets that are made of polyester or cotton.  Clean bathrooms and kitchens with bleach. If possible, have someone repaint the walls in these rooms with mold-resistant paint. Keep out of the rooms that are being cleaned and painted.  Wash hands frequently. SEEK MEDICAL CARE IF:   You have wheezing, shortness of breath, or a cough even if taking medicine to prevent attacks.  The colored mucus you cough up (sputum) is thicker than usual.  Your sputum changes from clear or white to yellow, green, gray, or bloody.  You have any problems that may be related to the medicines you are taking (such as a rash, itching, swelling, or trouble breathing).  You are using a reliever medicine more than 2-3 times per week.  Your peak flow is still at 50-79% of your personal best after following your action plan for 1 hour.  You have a fever. SEEK IMMEDIATE MEDICAL CARE IF:   You seem to be getting worse and are unresponsive to treatment during an asthma attack.  You are short of breath even at rest.  You get short of breath when doing very little physical activity.  You have difficulty eating, drinking, or talking due to asthma symptoms.  You develop chest pain.  You develop a fast heartbeat.  You have a bluish color to your lips or fingernails.  You are light-headed, dizzy, or faint.  Your peak flow is less than 50% of your personal best. MAKE SURE YOU:   Understand  these instructions.  Will watch your condition.  Will get help right away if you are not doing well or get worse. Document Released: 02/21/2005 Document Revised: 07/08/2013 Document Reviewed: 09/20/2012 [facility] Patient Information 2015 [place], [place]. This information is not intended to replace advice given to you by your health care provider. Make sure you discuss any questions you have with your health care provider.

## 2014-09-07 NOTE — ED Notes (Signed)
Awake. Verbally responsive. A/O x4. Resp even and unlabored. Occ nonproductive horase sounding cough noted. ABC's intact.
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
## 2014-09-07 NOTE — ED Notes (Signed)
Patient transported to X-ray 

## 2014-09-07 NOTE — ED Notes (Signed)
Pt c/o increasing SOB, cough, and congestion x 4 days.  Pain score 4/10.  Sts generalized chest and upper back soreness.  Hx of asthma.  Pt reports using inhaler w/o relief.

## 2014-09-07 NOTE — ED Notes (Signed)
Awake. Verbally responsive. A/O x4. Resp even and unlabored. No audible adventitious breath sounds noted. ABC's intact.  

## 2014-09-07 NOTE — ED Notes (Addendum)
Pt reported having occ productive cough of yellowish sputum. Auscultated clear breath sounds bil and throughout. Resp even and unlabored. ABC's intact.

## 2014-10-06 ENCOUNTER — Emergency Department (HOSPITAL_COMMUNITY): Payer: BC Managed Care – PPO

## 2014-10-06 ENCOUNTER — Emergency Department (HOSPITAL_COMMUNITY)
Admission: EM | Admit: 2014-10-06 | Discharge: 2014-10-06 | Disposition: A | Discharge status: Home / Self Care | Payer: BC Managed Care – PPO | Attending: Emergency Medicine | Admitting: Emergency Medicine

## 2014-10-06 ENCOUNTER — Encounter (HOSPITAL_COMMUNITY): Payer: Self-pay | Admitting: Emergency Medicine

## 2014-10-06 DIAGNOSIS — Y9389 Activity, other specified: Secondary | ICD-10-CM | POA: Insufficient documentation

## 2014-10-06 DIAGNOSIS — S199XXA Unspecified injury of neck, initial encounter: Secondary | ICD-10-CM | POA: Diagnosis present

## 2014-10-06 DIAGNOSIS — Z7982 Long term (current) use of aspirin: Secondary | ICD-10-CM | POA: Diagnosis not present

## 2014-10-06 DIAGNOSIS — S3992XA Unspecified injury of lower back, initial encounter: Secondary | ICD-10-CM | POA: Insufficient documentation

## 2014-10-06 DIAGNOSIS — S8991XA Unspecified injury of right lower leg, initial encounter: Secondary | ICD-10-CM | POA: Insufficient documentation

## 2014-10-06 DIAGNOSIS — K219 Gastro-esophageal reflux disease without esophagitis: Secondary | ICD-10-CM | POA: Insufficient documentation

## 2014-10-06 DIAGNOSIS — M1990 Unspecified osteoarthritis, unspecified site: Secondary | ICD-10-CM | POA: Diagnosis not present

## 2014-10-06 DIAGNOSIS — Y998 Other external cause status: Secondary | ICD-10-CM | POA: Insufficient documentation

## 2014-10-06 DIAGNOSIS — S4991XA Unspecified injury of right shoulder and upper arm, initial encounter: Secondary | ICD-10-CM | POA: Diagnosis not present

## 2014-10-06 DIAGNOSIS — E119 Type 2 diabetes mellitus without complications: Secondary | ICD-10-CM | POA: Insufficient documentation

## 2014-10-06 DIAGNOSIS — Z79899 Other long term (current) drug therapy: Secondary | ICD-10-CM | POA: Diagnosis not present

## 2014-10-06 DIAGNOSIS — M25531 Pain in right wrist: Secondary | ICD-10-CM

## 2014-10-06 DIAGNOSIS — Y92410 Unspecified street and highway as the place of occurrence of the external cause: Secondary | ICD-10-CM | POA: Diagnosis not present

## 2014-10-06 DIAGNOSIS — J45909 Unspecified asthma, uncomplicated: Secondary | ICD-10-CM | POA: Diagnosis not present

## 2014-10-06 DIAGNOSIS — S3991XA Unspecified injury of abdomen, initial encounter: Secondary | ICD-10-CM | POA: Diagnosis not present

## 2014-10-06 DIAGNOSIS — S299XXA Unspecified injury of thorax, initial encounter: Secondary | ICD-10-CM | POA: Insufficient documentation

## 2014-10-06 DIAGNOSIS — S161XXA Strain of muscle, fascia and tendon at neck level, initial encounter: Secondary | ICD-10-CM | POA: Diagnosis not present

## 2014-10-06 DIAGNOSIS — I10 Essential (primary) hypertension: Secondary | ICD-10-CM | POA: Insufficient documentation

## 2014-10-06 DIAGNOSIS — E785 Hyperlipidemia, unspecified: Secondary | ICD-10-CM | POA: Insufficient documentation

## 2014-10-06 DIAGNOSIS — S6991XA Unspecified injury of right wrist, hand and finger(s), initial encounter: Secondary | ICD-10-CM | POA: Diagnosis not present

## 2014-10-06 LAB — I-STAT CHEM 8, ED
BUN: 10 mg/dL (ref 6–20)
CALCIUM ION: 1.19 mmol/L (ref 1.12–1.23)
CHLORIDE: 105 mmol/L (ref 101–111)
Creatinine, Ser: 0.8 mg/dL (ref 0.44–1.00)
Glucose, Bld: 124 mg/dL — ABNORMAL HIGH (ref 65–99)
HEMATOCRIT: 41 % (ref 36.0–46.0)
Hemoglobin: 13.9 g/dL (ref 12.0–15.0)
POTASSIUM: 3.6 mmol/L (ref 3.5–5.1)
Sodium: 143 mmol/L (ref 135–145)
TCO2: 24 mmol/L (ref 0–100)

## 2014-10-06 MED ORDER — OXYCODONE-ACETAMINOPHEN 5-325 MG PO TABS: 1.0000 | ORAL_TABLET | Freq: Four times a day (QID) | ORAL | Status: DC | PRN

## 2014-10-06 MED ORDER — ONDANSETRON HCL 4 MG/2ML IJ SOLN
4.0000 mg | Freq: Once | INTRAMUSCULAR | Status: DC
  Filled 2014-10-06: qty 2

## 2014-10-06 MED ORDER — MORPHINE SULFATE 4 MG/ML IJ SOLN
4.0000 mg | Freq: Once | INTRAMUSCULAR | Status: DC
  Filled 2014-10-06: qty 1

## 2014-10-06 MED ORDER — FENTANYL CITRATE (PF) 100 MCG/2ML IJ SOLN
50.0000 ug | Freq: Once | INTRAMUSCULAR | Status: AC
  Administered 2014-10-06: 50 ug via INTRAVENOUS
  Filled 2014-10-06: qty 2

## 2014-10-06 MED ORDER — IOHEXOL 300 MG/ML  SOLN
100.0000 mL | Freq: Once | INTRAMUSCULAR | Status: AC | PRN
  Administered 2014-10-06: 100 mL via INTRAVENOUS
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
## 2014-10-06 NOTE — Progress Notes (Signed)
Orthopedic Tech Progress Note Patient Details:  Kristen Holland 05/13/1965 [id number]  Ortho Devices Type of Ortho Device: Thumb velcro splint, Rigid collar   Shawnie Pons 10/06/2014, 6:09 PM

## 2014-10-06 NOTE — ED Notes (Signed)
Patient transported to CT 
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
## 2015-02-08 ENCOUNTER — Encounter (HOSPITAL_COMMUNITY): Payer: Self-pay | Admitting: Emergency Medicine

## 2015-02-08 ENCOUNTER — Emergency Department (HOSPITAL_COMMUNITY)
Admission: EM | Admit: 2015-02-08 | Discharge: 2015-02-08 | Disposition: A | Discharge status: Home / Self Care | Payer: BC Managed Care – PPO | Attending: Physician Assistant | Admitting: Physician Assistant

## 2015-02-08 ENCOUNTER — Other Ambulatory Visit: Payer: Self-pay

## 2015-02-08 ENCOUNTER — Emergency Department (HOSPITAL_COMMUNITY): Payer: BC Managed Care – PPO

## 2015-02-08 DIAGNOSIS — Z7982 Long term (current) use of aspirin: Secondary | ICD-10-CM | POA: Diagnosis not present

## 2015-02-08 DIAGNOSIS — Z7984 Long term (current) use of oral hypoglycemic drugs: Secondary | ICD-10-CM | POA: Insufficient documentation

## 2015-02-08 DIAGNOSIS — I10 Essential (primary) hypertension: Secondary | ICD-10-CM | POA: Diagnosis not present

## 2015-02-08 DIAGNOSIS — R079 Chest pain, unspecified: Secondary | ICD-10-CM | POA: Diagnosis not present

## 2015-02-08 DIAGNOSIS — Z3202 Encounter for pregnancy test, result negative: Secondary | ICD-10-CM | POA: Diagnosis not present

## 2015-02-08 DIAGNOSIS — M1990 Unspecified osteoarthritis, unspecified site: Secondary | ICD-10-CM | POA: Diagnosis not present

## 2015-02-08 DIAGNOSIS — E119 Type 2 diabetes mellitus without complications: Secondary | ICD-10-CM | POA: Insufficient documentation

## 2015-02-08 DIAGNOSIS — E785 Hyperlipidemia, unspecified: Secondary | ICD-10-CM | POA: Diagnosis not present

## 2015-02-08 DIAGNOSIS — Z79899 Other long term (current) drug therapy: Secondary | ICD-10-CM | POA: Insufficient documentation

## 2015-02-08 DIAGNOSIS — Z7951 Long term (current) use of inhaled steroids: Secondary | ICD-10-CM | POA: Insufficient documentation

## 2015-02-08 DIAGNOSIS — K219 Gastro-esophageal reflux disease without esophagitis: Secondary | ICD-10-CM | POA: Insufficient documentation

## 2015-02-08 DIAGNOSIS — J45901 Unspecified asthma with (acute) exacerbation: Secondary | ICD-10-CM | POA: Diagnosis not present

## 2015-02-08 DIAGNOSIS — M79601 Pain in right arm: Secondary | ICD-10-CM | POA: Diagnosis not present

## 2015-02-08 LAB — I-STAT TROPONIN, ED
TROPONIN I, POC: 0 ng/mL (ref 0.00–0.08)
TROPONIN I, POC: 0 ng/mL (ref 0.00–0.08)

## 2015-02-08 LAB — BASIC METABOLIC PANEL
ANION GAP: 8 (ref 5–15)
BUN: 9 mg/dL (ref 6–20)
CALCIUM: 9.1 mg/dL (ref 8.9–10.3)
CO2: 23 mmol/L (ref 22–32)
Chloride: 107 mmol/L (ref 101–111)
Creatinine, Ser: 0.85 mg/dL (ref 0.44–1.00)
Glucose, Bld: 117 mg/dL — ABNORMAL HIGH (ref 65–99)
Potassium: 4 mmol/L (ref 3.5–5.1)
SODIUM: 138 mmol/L (ref 135–145)

## 2015-02-08 LAB — CBC
HCT: 40.5 % (ref 36.0–46.0)
HEMOGLOBIN: 13.3 g/dL (ref 12.0–15.0)
MCH: 27.5 pg (ref 26.0–34.0)
MCHC: 32.8 g/dL (ref 30.0–36.0)
MCV: 83.7 fL (ref 78.0–100.0)
PLATELETS: 349 10*3/uL (ref 150–400)
RBC: 4.84 MIL/uL (ref 3.87–5.11)
RDW: 13 % (ref 11.5–15.5)
WBC: 8.4 10*3/uL (ref 4.0–10.5)

## 2015-02-08 LAB — I-STAT BETA HCG BLOOD, ED (MC, WL, AP ONLY): I-stat hCG, quantitative: <5 m[IU]/mL (ref <5)

## 2015-02-08 LAB — I-STAT CREATININE, ED: Creatinine, Ser: 0.8 mg/dL (ref 0.44–1.00)

## 2015-02-08 LAB — LIPASE, BLOOD: LIPASE: 27 U/L (ref 11–51)

## 2015-02-08 MED ORDER — CYCLOBENZAPRINE HCL 10 MG PO TABS: 10.0000 mg | ORAL_TABLET | Freq: Two times a day (BID) | ORAL | Status: DC | PRN

## 2015-02-08 MED ORDER — CYCLOBENZAPRINE HCL 10 MG PO TABS
10.0000 mg | ORAL_TABLET | Freq: Once | ORAL | Status: AC
  Administered 2015-02-08: 10 mg via ORAL
  Filled 2015-02-08 (×2): qty 1

## 2015-02-08 MED ORDER — IBUPROFEN 800 MG PO TABS: 800.0000 mg | ORAL_TABLET | Freq: Three times a day (TID) | ORAL | Status: DC

## 2015-02-08 MED ORDER — IBUPROFEN 800 MG PO TABS
800.0000 mg | ORAL_TABLET | Freq: Once | ORAL | Status: AC
  Administered 2015-02-08: 800 mg via ORAL
  Filled 2015-02-08: qty 1

## 2015-02-08 NOTE — Discharge Instructions (Signed)

## 2015-02-08 NOTE — ED Notes (Signed)
Pt. Stated, i started having chest pain this morning with right arm pain and SOB.  I have acid refux but take my medicine religously.

## 2015-02-08 NOTE — ED Provider Notes (Signed)
CSN: [id number]     Arrival date & time 02/08/15  0847 History   First MD Initiated Contact with Patient 02/08/15 [phone]     Chief Complaint  Patient presents with  . Chest Pain  . Shortness of Breath  . Arm Pain     (Consider location/radiation/quality/duration/timing/severity/associated sxs/prior Treatment) HPI   Patient is a 49 year old female presenting with pain with moving her right arm. Patient states that she was in her usual state of health until this she woke up this morning. She says that when she woke up she had pain when moving her right arm. She reports this is different than her usual GERD symptoms. Patient has history of hypertension hyperlipidemia and diabetes and obesity. She denies any shortness of breath or diaphoresis. The pain is only with moving her arm not with walking, exercising or resting.  Patient has no nausea. No vomiting. No fevers.  Patient has not been any long car or plane trips. No estrogens. No unilateral leg swelling.  Past Medical History  Diagnosis Date  . Abdominal pain   . Nausea & vomiting   . Diabetes mellitus without complication (HCC)   . Hyperlipidemia   . Hypertension   . GERD (gastroesophageal reflux disease)   . Asthma   . Arthritis   . Allergy    Past Surgical History  Procedure Laterality Date  . Abdominal hysterectomy    . Appendectomy    . Brain surgery     Family History  Problem Relation Age of Onset  . Hypertension Mother   . Diabetes Mother   . Cancer Mother   . Cancer Father   . Diabetes Father   . Hypertension Father    Social History  Substance Use Topics  . Smoking status: Never Smoker   . Smokeless tobacco: None  . Alcohol Use: No   OB History    No data available     Review of Systems  Constitutional: Negative for activity change.  Respiratory: Negative for shortness of breath.   Cardiovascular: Positive for chest pain. Negative for palpitations.  Gastrointestinal: Negative for abdominal pain.   Genitourinary: Negative for dysuria.  Neurological: Negative for dizziness.  Psychiatric/Behavioral: Negative for confusion.      Allergies  Review of patient's allergies indicates no known allergies.  Home Medications   Prior to Admission medications   Medication Sig Start Date End Date Taking? Authorizing Provider  albuterol (PROVENTIL HFA;VENTOLIN HFA) 108 (90 BASE) MCG/ACT inhaler Inhale 1-2 puffs into the lungs every 6 (six) hours as needed for wheezing or shortness of breath. Patient taking differently: Inhale 2 puffs into the lungs every 6 (six) hours as needed for wheezing or shortness of breath.  11/21/13  Yes Mellody DrownLauren Parker, PA-C  aspirin EC 81 MG tablet Take 81 mg by mouth daily.   Yes Historical Provider, MD  beclomethasone (QVAR) 40 MCG/ACT inhaler Inhale 1 puff into the lungs daily.    Yes Historical Provider, MD  chlorpheniramine-HYDROcodone (TUSSIONEX PENNKINETIC ER) 10-8 MG/5ML SUER Take 5 mLs by mouth every 12 (twelve) hours as needed for cough. 09/07/14  Yes Geoffery Lyonsouglas Delo, MD  dexlansoprazole (DEXILANT) 60 MG capsule Take 60 mg by mouth daily.   Yes Historical Provider, MD  Liraglutide (VICTOZA) 18 MG/3ML SOLN Inject 1.8 mg into the skin every morning.    Yes Historical Provider, MD  metFORMIN (GLUCOPHAGE) 500 MG tablet Take 500 mg by mouth every evening.    Yes Historical Provider, MD  montelukast (SINGULAIR) 10 MG tablet Take  10 mg by mouth at bedtime.   Yes Historical Provider, MD  oxyCODONE-acetaminophen (PERCOCET/ROXICET) 5-325 MG per tablet Take 1 tablet by mouth every 6 (six) hours as needed for severe pain. 10/06/14  Yes Roxy Horseman, PA-C  sucralfate (CARAFATE) 1 GM/10ML suspension Take 1 g by mouth 4 (four) times daily as needed (acid reflux).    Yes Historical Provider, MD  valsartan-hydrochlorothiazide (DIOVAN-HCT) 160-12.5 MG per tablet Take 1 tablet by mouth daily.   Yes Historical Provider, MD  Vitamin D, Ergocalciferol, (DRISDOL) 50000 UNITS CAPS capsule  Take 50,000 Units by mouth 2 (two) times a week. Tuesday and friday   Yes Historical Provider, MD  cyclobenzaprine (FLEXERIL) 10 MG tablet Take 1 tablet (10 mg total) by mouth 2 (two) times daily as needed for muscle spasms. 02/08/15   Courteney Lyn Mackuen, MD  ibuprofen (ADVIL,MOTRIN) 800 MG tablet Take 1 tablet (800 mg total) by mouth 3 (three) times daily. 02/08/15   Courteney Lyn Mackuen, MD   BP 111/67 mmHg  Pulse 83  Temp(Src) 98.2 F (36.8 C) (Oral)  Resp 24  Ht  (1.651 m)  Wt 230 lb (104.327 kg)  BMI 38.27 kg/m2  SpO2 96% Physical Exam  Constitutional: She is oriented to person, place, and time. She appears well-developed and well-nourished.  HENT:  Head: Normocephalic and atraumatic.  Eyes: Right eye exhibits no discharge.  Cardiovascular: Normal rate, regular rhythm and normal heart sounds.   No murmur heard. Pulmonary/Chest: Effort normal and breath sounds normal. She has no wheezes. She has no rales.  Abdominal: Soft. She exhibits no distension. There is no tenderness.  Musculoskeletal: She exhibits no edema.  Neurological: She is oriented to person, place, and time.  Skin: Skin is warm and dry. She is not diaphoretic.  Psychiatric: She has a normal mood and affect.  Nursing note and vitals reviewed.   ED Course  Procedures (including critical care time) Labs Review Labs Reviewed  BASIC METABOLIC PANEL - Abnormal; Notable for the following:    Glucose, Bld 117 (*)    All other components within normal limits  CBC  LIPASE, BLOOD  I-STAT TROPOININ, ED  I-STAT TROPOININ, ED  I-STAT BETA HCG BLOOD, ED ([facility], [facility], [facility] ONLY)  I-STAT CREATININE, ED  Rosezena Sensor, ED    Imaging Review Dg Chest 2 View  02/08/2015  CLINICAL DATA:  Right upper chest pain and back pain. EXAM: CHEST - 2 VIEW COMPARISON:  09/07/2014 FINDINGS: The heart size and mediastinal contours are within normal limits. There is no evidence of pulmonary edema, consolidation, pneumothorax, nodule  or pleural fluid. The visualized skeletal structures are unremarkable. IMPRESSION: No active disease. Electronically Signed   By: Irish Lack M.D.   On: 02/08/2015 09:38   I have personally reviewed and evaluated these images and lab results as part of my medical decision-making.   EKG Interpretation   Date/Time:  Sunday February 08 2015 10:07:50 EST Ventricular Rate:  70 PR Interval:  152 QRS Duration: 77 QT Interval:  396 QTC Calculation: 427 R Axis:   78 Text Interpretation:  Sinus rhythm no acute ischemia No significant change  since last tracing Confirmed by Kandis Mannan ([id number]) on 02/08/2015  11:03:22 AM      MDM   Final diagnoses:  Chest pain, unspecified chest pain type    Patient is a 49 year old female who appears in no acute distress presenting today with pain with moving her right arm that started overnight last night first noticed this morning at  6 AM. Patient has diabetes hypertension hyperlipidemia. The pain is present with palpation and made worse when she raises her arm to her side. This seems very musculoskeletal in nature. There is no diaphoresis, shortness of breath. The pain is only associated with movement. Given that it seems musculoskeletal in nature we will treat as such in the meantime we'll get a single troponin and EKG given that the pain has been going on for 4 hours I anticipate this is enough time to shows a positive troponin, but she has sig rsik factors, so will delta trop.   EKG normal. Normal labs.   Deltra trop negative.  Will have her follow up with PCP.    Courteney Randall An, MD 02/08/15 1540

## 2015-02-08 NOTE — ED Notes (Signed)
Phlebotomy at the bedside  

## 2016-02-04 ENCOUNTER — Other Ambulatory Visit: Payer: Self-pay | Admitting: Neurosurgery

## 2016-02-04 DIAGNOSIS — M5416 Radiculopathy, lumbar region: Secondary | ICD-10-CM

## 2016-02-13 ENCOUNTER — Inpatient Hospital Stay: Admission: RE | Admit: 2016-02-13 | Payer: BC Managed Care – PPO | Source: Ambulatory Visit

## 2016-02-20 ENCOUNTER — Ambulatory Visit (HOSPITAL_COMMUNITY)
Admission: EM | Admit: 2016-02-20 | Discharge: 2016-02-20 | Disposition: A | Discharge status: Home / Self Care | Payer: BC Managed Care – PPO | Attending: Family Medicine | Admitting: Family Medicine

## 2016-02-20 ENCOUNTER — Other Ambulatory Visit: Payer: Self-pay

## 2016-02-20 ENCOUNTER — Encounter (HOSPITAL_COMMUNITY): Payer: Self-pay | Admitting: *Deleted

## 2016-02-20 DIAGNOSIS — R0789 Other chest pain: Secondary | ICD-10-CM | POA: Diagnosis not present

## 2016-02-20 DIAGNOSIS — R0982 Postnasal drip: Secondary | ICD-10-CM

## 2016-02-20 DIAGNOSIS — M791 Myalgia, unspecified site: Secondary | ICD-10-CM

## 2016-02-20 NOTE — ED Provider Notes (Signed)
CSN: [id number]     Arrival date & time 02/20/16  1538 History   First MD Initiated Contact with Patient 02/20/16 1641     Chief Complaint  Patient presents with  . Chest Pain   (Consider location/radiation/quality/duration/timing/severity/associated sxs/prior Treatment) 50 year old female states that she awoke this morning with right shoulder pain. Located to the right upper anterior chest and into the right anterior shoulder. Pain is exacerbated by taking a deep breath, moving the arm such as with abduction and moving the arm across the chest. She states she has asthma and occasionally gets some shortness of breath but that is chronic. She also complaining of a scratchy throat.        Past Medical History:  Diagnosis Date  . Abdominal pain   . Allergy   . Arthritis   . Asthma   . Diabetes mellitus without complication (HCC)   . GERD (gastroesophageal reflux disease)   . Hyperlipidemia   . Hypertension   . Nausea & vomiting    Past Surgical History:  Procedure Laterality Date  . ABDOMINAL HYSTERECTOMY    . APPENDECTOMY    . BACK SURGERY     Family History  Problem Relation Age of Onset  . Hypertension Mother   . Diabetes Mother   . Cancer Mother   . Cancer Father   . Diabetes Father   . Hypertension Father    Social History  Substance Use Topics  . Smoking status: Never Smoker  . Smokeless tobacco: Never Used  . Alcohol use No   OB History    No data available     Review of Systems  Constitutional: Negative.  Negative for fever.  HENT:       Scratchy throat otherwise negative.  Eyes: Negative.   Respiratory: Negative for cough and chest tightness.        Occasional shortness of breath on exertion.  Cardiovascular: Positive for chest pain.  Gastrointestinal: Negative.   Genitourinary: Negative.   Musculoskeletal: Positive for back pain and myalgias.  Skin: Negative.   Neurological: Negative.     Allergies  Patient has no known allergies.  Home  Medications   Prior to Admission medications   Medication Sig Start Date End Date Taking? Authorizing Provider  albuterol (PROVENTIL HFA;VENTOLIN HFA) 108 (90 BASE) MCG/ACT inhaler Inhale 1-2 puffs into the lungs every 6 (six) hours as needed for wheezing or shortness of breath. Patient taking differently: Inhale 2 puffs into the lungs every 6 (six) hours as needed for wheezing or shortness of breath.  11/21/13  Yes Mellody Drown, PA-C  aspirin EC 81 MG tablet Take 81 mg by mouth daily.   Yes Historical Provider, MD  beclomethasone (QVAR) 40 MCG/ACT inhaler Inhale 1 puff into the lungs daily.    Yes Historical Provider, MD  dexlansoprazole (DEXILANT) 60 MG capsule Take 60 mg by mouth daily.   Yes Historical Provider, MD  Liraglutide (VICTOZA) 18 MG/3ML SOLN Inject 1.8 mg into the skin every morning.    Yes Historical Provider, MD  metFORMIN (GLUCOPHAGE) 500 MG tablet Take 500 mg by mouth every evening.    Yes Historical Provider, MD  montelukast (SINGULAIR) 10 MG tablet Take 10 mg by mouth at bedtime.   Yes Historical Provider, MD  valsartan-hydrochlorothiazide (DIOVAN-HCT) 160-12.5 MG per tablet Take 1 tablet by mouth daily.   Yes Historical Provider, MD  Vitamin D, Ergocalciferol, (DRISDOL) 50000 UNITS CAPS capsule Take 50,000 Units by mouth 2 (two) times a week. Tuesday and friday  Yes Historical Provider, MD  chlorpheniramine-HYDROcodone (TUSSIONEX PENNKINETIC ER) 10-8 MG/5ML SUER Take 5 mLs by mouth every 12 (twelve) hours as needed for cough. 09/07/14   Geoffery Lyonsouglas Delo, MD  cyclobenzaprine (FLEXERIL) 10 MG tablet Take 1 tablet (10 mg total) by mouth 2 (two) times daily as needed for muscle spasms. 02/08/15   Courteney Lyn Mackuen, MD  ibuprofen (ADVIL,MOTRIN) 800 MG tablet Take 1 tablet (800 mg total) by mouth 3 (three) times daily. 02/08/15   Courteney Lyn Mackuen, MD  oxyCODONE-acetaminophen (PERCOCET/ROXICET) 5-325 MG per tablet Take 1 tablet by mouth every 6 (six) hours as needed for severe pain.  10/06/14   Roxy Horsemanobert Browning, PA-C  sucralfate (CARAFATE) 1 GM/10ML suspension Take 1 g by mouth 4 (four) times daily as needed (acid reflux).     Historical Provider, MD   Meds Ordered and Administered this Visit  Medications - No data to display  BP 117/71   Pulse 80   Temp 98.2 F (36.8 C) (Oral)   Resp 16   SpO2 100%  No data found.   Physical Exam  Constitutional: She is oriented to person, place, and time. She appears well-developed and well-nourished. No distress.  Patient is sitting on the exam table with relaxed posturing. Respirations are calm, even and nonlabored.  HENT:  Head: Normocephalic and atraumatic.  Oropharynx clear and moist with mild clear PND. No exudate or swelling.  TMs are normal.    Eyes: EOM are normal.  Neck: Normal range of motion. Neck supple.  Cardiovascular: Normal rate, regular rhythm, normal heart sounds and intact distal pulses.  Exam reveals no gallop and no friction rub.   No murmur heard. Pulmonary/Chest: Effort normal and breath sounds normal. No respiratory distress. She has no wheezes. She has no rales. She exhibits tenderness.  Unequivocal reproducible right anterior chest wall tenderness. Pain is also reproduced with having the patient abduct the arm and move it across her chest. She also states there is pain in the right upper back when moving the arm behind her. The pain and tenderness is well localized to the muscles of the infrascapular area. Patient states that palpation of the right anterior chest wall reproduces the same pain for which she developed this morning and that for which she presented to the urgent care.  Lungs are perfectly clear. No adventitious sounds. Good air movement in all fields. Good chest expansion. Brisk expiratory phase.  Musculoskeletal: She exhibits tenderness. She exhibits no edema or deformity.  Neurological: She is alert and oriented to person, place, and time.  Skin: Skin is warm and dry.  Psychiatric: She  has a normal mood and affect.  Nursing note and vitals reviewed.   Urgent Care Course   Clinical Course   ED ECG REPORT   Date: 02/20/2016  Rate: 74  Rhythm: normal sinus rhythm  QRS Axis: normal  Intervals: normal  ST/T Wave abnormalities: nonspecific T wave changes  Conduction Disutrbances:none  Narrative Interpretation:   Old EKG Reviewed: unchanged  I have personally reviewed the EKG tracing and agree with the computerized printout as noted.   Procedures (including critical care time)  Labs Review Labs Reviewed - No data to display  Imaging Review No results found.   Visual Acuity Review  Right Eye Distance:   Left Eye Distance:   Bilateral Distance:    Right Eye Near:   Left Eye Near:    Bilateral Near:         MDM   1. Chest wall pain  2. Muscle pain   3. PND (post-nasal drip)    Your chest pain is musculoskeletal. It is due to sore muscles in your chest as well as inflammation around the ribs. It is likely due to some sort of movement or positioning of your arm or shoulder recently. There is tenderness to the muscle in your upper right back as well. Your EKG is normal. Remainder. Exam is also normal. For your scratchy throat he may take Zyrtec or your other allergy medicine to help with drainage. Follow-up with your primary care doctor as needed.     Hayden Rasmussenavid Jerni Selmer, NP 02/20/16 [phone]

## 2016-02-20 NOTE — Discharge Instructions (Signed)
Your chest pain is musculoskeletal. It is due to sore muscles in your chest as well as inflammation around the ribs. It is likely due to some sort of movement or positioning of your arm or shoulder recently. There is tenderness to the muscle in your upper right back as well. Your EKG is normal. Remainder. Exam is also normal. For your scratchy throat he may take Zyrtec or your other allergy medicine to help with drainage. Follow-up with your primary care doctor as needed.

## 2016-02-20 NOTE — ED Triage Notes (Signed)
Woke this AM with constant right chest pain radiating down into RUE; pain is worse whenever she bends forward.  Pain much worse when breathing with walking, palpation, and pain is much worse with coughing.   Denies having any cold sxs.  Has had "few seconds" of nausea and dizziness which have resolved.

## 2016-03-09 DIAGNOSIS — R109 Unspecified abdominal pain: Secondary | ICD-10-CM | POA: Diagnosis not present

## 2016-03-09 DIAGNOSIS — R350 Frequency of micturition: Secondary | ICD-10-CM | POA: Diagnosis not present

## 2016-03-14 ENCOUNTER — Ambulatory Visit (INDEPENDENT_AMBULATORY_CARE_PROVIDER_SITE_OTHER): Payer: BC Managed Care – PPO | Admitting: Physician Assistant

## 2016-03-14 ENCOUNTER — Ambulatory Visit (INDEPENDENT_AMBULATORY_CARE_PROVIDER_SITE_OTHER): Payer: BC Managed Care – PPO

## 2016-03-14 VITALS — BP 137/85 | HR 71 | Temp 97.4°F | Resp 18 | Ht 65.0 in | Wt 231.8 lb

## 2016-03-14 DIAGNOSIS — R1011 Right upper quadrant pain: Secondary | ICD-10-CM

## 2016-03-14 DIAGNOSIS — K5900 Constipation, unspecified: Secondary | ICD-10-CM

## 2016-03-14 LAB — POCT CBC
Granulocyte percent: 58 %G (ref 37–80)
HCT, POC: 35.9 % — AB (ref 37.7–47.9)
Hemoglobin: 12.9 g/dL (ref 12.2–16.2)
Lymph, poc: 2.7 (ref 0.6–3.4)
MCH: 29.1 pg (ref 27–31.2)
MCHC: 35.9 g/dL — AB (ref 31.8–35.4)
MCV: 81.2 fL (ref 80–97)
MID (CBC): 0.3 (ref 0–0.9)
MPV: 7.2 fL (ref 0–99.8)
PLATELET COUNT, POC: 297 10*3/uL (ref 142–424)
POC Granulocyte: 4.1 (ref 2–6.9)
POC LYMPH PERCENT: 37.4 %L (ref 10–50)
POC MID %: 4.6 %M (ref 0–12)
RBC: 4.42 M/uL (ref 4.04–5.48)
RDW, POC: 13.3 %
WBC: 7.1 10*3/uL (ref 4.6–10.2)

## 2016-03-14 LAB — POCT URINALYSIS DIP (MANUAL ENTRY)
Bilirubin, UA: NEGATIVE
GLUCOSE UA: NEGATIVE
Ketones, POC UA: NEGATIVE
Leukocytes, UA: NEGATIVE
NITRITE UA: NEGATIVE
PH UA: 7
RBC UA: NEGATIVE
Spec Grav, UA: 1.025
UROBILINOGEN UA: 1

## 2016-03-14 MED ORDER — ACETAMINOPHEN 500 MG PO TABS
1000.0000 mg | ORAL_TABLET | Freq: Once | ORAL | Status: AC
Start: 2016-03-14 — End: 2016-03-14
  Administered 2016-03-14: 1000 mg via ORAL

## 2016-03-14 NOTE — Progress Notes (Signed)
03/14/2016 9:17 AM   DOB: [birth year] / MRN: [id number]  SUBJECTIVE:  Kristen Holland is a 51 y.o. female presenting for abdominal pain that started two weeks ago.  The pain is sharp, located in the right upper quadrant, and is keeping her up at night.  She associates nausea and one episode of emesis nearer the onset of the illness.  She has a history of gall bladder problems and was told at one time by her PCP that "her gall bladder needs to be removed" however a subsequent scan showed this was not the case. She did have a gall bladder EF in 2013 and this was low, however was negative for other abnormalities. This test was not repeated due to subsequent normal ultrasounds.   She has No Known Allergies.   She  has a past medical history of Abdominal pain; Allergy; Arthritis; Asthma; Diabetes mellitus without complication (HCC); GERD (gastroesophageal reflux disease); Hyperlipidemia; Hypertension; and Nausea & vomiting.    She  reports that she has never smoked. She has never used smokeless tobacco. She reports that she does not drink alcohol or use drugs. She  reports that she does not engage in sexual activity. The patient  has a past surgical history that includes Abdominal hysterectomy; Appendectomy; and Back surgery.  Her family history includes Cancer in her father and mother; Diabetes in her father and mother; Hypertension in her father and mother.  Review of Systems  Constitutional: Negative for chills and fever.  Gastrointestinal: Positive for abdominal pain, nausea and vomiting. Negative for blood in stool, constipation, diarrhea, heartburn and melena.  Skin: Negative for rash.  Neurological: Negative for dizziness.    The problem list and medications were reviewed and updated by myself where necessary and exist elsewhere in the encounter.   OBJECTIVE:  BP 137/85   Pulse 71   Temp 97.4 F (36.3 C) (Oral)   Resp 18   Ht 5\' 5"  (1.651 m)   Wt 231 lb 12.8 oz (105.1 kg)   SpO2 97%   BMI  38.57 kg/m   Physical Exam  Constitutional: She is oriented to person, place, and time.  Cardiovascular: Normal rate and regular rhythm.   Pulmonary/Chest: Effort normal and breath sounds normal.  Abdominal: She exhibits no distension and no mass. There is tenderness (right sided). There is no rebound and no guarding.  Musculoskeletal: Normal range of motion.  Neurological: She is alert and oriented to person, place, and time.     Results for orders placed or performed in visit on 03/14/16 (from the past 72 hour(s))  POCT CBC     Status: Abnormal   Collection Time: 03/14/16  8:55 AM  Result Value Ref Range   WBC 7.1 4.6 - 10.2 K/uL   Lymph, poc 2.7 0.6 - 3.4   POC LYMPH PERCENT 37.4 10 - 50 %L   MID (cbc) 0.3 0 - 0.9   POC MID % 4.6 0 - 12 %M   POC Granulocyte 4.1 2 - 6.9   Granulocyte percent 58.0 37 - 80 %G   RBC 4.42 4.04 - 5.48 M/uL   Hemoglobin 12.9 12.2 - 16.2 g/dL   HCT, POC 40.9 (A) 81.1 - 47.9 %   MCV 81.2 80 - 97 fL   MCH, POC 29.1 27 - 31.2 pg   MCHC 35.9 (A) 31.8 - 35.4 g/dL   RDW, POC 91.4 %   Platelet Count, POC 297 142 - 424 K/uL   MPV 7.2 0 - 99.8 fL  POCT urinalysis dipstick     Status: Abnormal   Collection Time: 03/14/16  9:08 AM  Result Value Ref Range   Color, UA yellow yellow   Clarity, UA clear clear   Glucose, UA negative negative   Bilirubin, UA negative negative   Ketones, POC UA negative negative   Spec Grav, UA 1.025    Blood, UA negative negative   pH, UA 7.0    Protein Ur, POC trace (A) negative   Urobilinogen, UA 1.0    Nitrite, UA Negative Negative   Leukocytes, UA Negative Negative    Dg Abd 2 Views  Result Date: 03/14/2016 CLINICAL DATA:  Right upper quadrant pain for 1 week EXAM: ABDOMEN - 2 VIEW COMPARISON:  10/06/2014 CT scan FINDINGS: There is normal small bowel gas pattern. No evidence of free abdominal air. Moderate stool noted in right colon. Some colonic stool noted in descending colon. IMPRESSION: Normal small bowel gas  pattern. Moderate stool noted in right colon. Electronically Signed   By: Natasha MeadLiviu  Pop M.D.   On: 03/14/2016 09:02    ASSESSMENT AND PLAN:  Kristen Holland was seen today for flank pain and sleeping problem.  Diagnoses and all orders for this visit:  Pain, abdominal, RUQ: See rads. Labs not acute.   Advised colon prep via avs.  If this does not help will quantify gall bladder function given it has been low in the past.  -     POCT CBC -     POCT urinalysis dipstick -     Lipase -     Hepatic function panel -     DG Abd 2 Views; Future -     acetaminophen (TYLENOL) tablet 1,000 mg; Take 2 tablets (1,000 mg total) by mouth once.  o  The patient is advised to call or return to clinic if she does not see an improvement in symptoms, or to seek the care of the closest emergency department if she worsens with the above plan.   Deliah BostonMichael Hildreth Orsak, MHS, PA-C [facility] 03/14/2016 9:17 AM

## 2016-03-14 NOTE — Patient Instructions (Addendum)
For constipation   Make sure you are drinking enough water daily. Make sure you are getting enough fiber in your diet - this will make you regular - you can eat high fiber foods or use metamucil as a supplement - it is really important to drink enough water when using fiber supplements.  If your stools are hard or are formed balls or you have to strain a stool softener will help - use colace 2-3 capsule a day  For gentle treatment of constipation Use Miralax 1-2 capfuls a day until your stools are soft and regular and then decrease the usage - you can use this daily  For more aggressive treatment of constipation Use 4 capfuls of Colace and 6 doses of Miralax and drink it in 2 hours - this should result in several watery stools - if it does not repeat the next day and then go to daily miralax for a week to make sure your bowels are clean and retrained to work properly  For the most aggressive treatment of constipation Use 14 capfuls of Miralax in 1 gallon of fluid (gatoraid or water work well or a combination of the two) and drink over 12h - it is ok to eat during this time and then use Miralax 1 capful daily for about 2 weeks to prevent the constipation from returning    IF you received an x-ray today, you will receive an invoice from [facility]. Please contact [facility] at [phone] with questions or concerns regarding your invoice.   IF you received labwork today, you will receive an invoice from [facility]. Please contact [facility] at [phone] with questions or concerns regarding your invoice.   Our billing staff will not be able to assist you with questions regarding bills from these companies.  You will be contacted with the lab results as soon as they are available. The fastest way to get your results is to activate your My Chart account. Instructions are located on the last page of this paperwork. If you have not heard from us regarding the results in 2 weeks,  please contact this office.     

## 2016-03-15 ENCOUNTER — Other Ambulatory Visit: Payer: Self-pay | Admitting: Physician Assistant

## 2016-03-15 DIAGNOSIS — R109 Unspecified abdominal pain: Secondary | ICD-10-CM

## 2016-03-15 LAB — HEPATIC FUNCTION PANEL
ALT: 40 IU/L — AB (ref 0–32)
AST: 23 IU/L (ref 0–40)
Albumin: 4.2 g/dL (ref 3.5–5.5)
Alkaline Phosphatase: 77 IU/L (ref 39–117)
BILIRUBIN TOTAL: 0.2 mg/dL (ref 0.0–1.2)
Bilirubin, Direct: 0.06 mg/dL (ref 0.00–0.40)
Total Protein: 7 g/dL (ref 6.0–8.5)

## 2016-03-15 LAB — LIPASE: LIPASE: 22 U/L (ref 14–72)

## 2016-03-18 ENCOUNTER — Ambulatory Visit
Admission: RE | Admit: 2016-03-18 | Discharge: 2016-03-18 | Disposition: A | Discharge status: Home / Self Care | Payer: BC Managed Care – PPO | Source: Ambulatory Visit | Attending: Physician Assistant | Admitting: Physician Assistant

## 2016-03-18 DIAGNOSIS — K760 Fatty (change of) liver, not elsewhere classified: Secondary | ICD-10-CM | POA: Diagnosis not present

## 2016-03-18 DIAGNOSIS — R109 Unspecified abdominal pain: Secondary | ICD-10-CM

## 2016-03-19 ENCOUNTER — Encounter (HOSPITAL_COMMUNITY): Payer: Self-pay | Admitting: Emergency Medicine

## 2016-03-19 ENCOUNTER — Emergency Department (HOSPITAL_COMMUNITY)
Admission: EM | Admit: 2016-03-19 | Discharge: 2016-03-19 | Disposition: A | Discharge status: Home / Self Care | Payer: BC Managed Care – PPO | Attending: Emergency Medicine | Admitting: Emergency Medicine

## 2016-03-19 ENCOUNTER — Emergency Department (HOSPITAL_COMMUNITY): Payer: BC Managed Care – PPO

## 2016-03-19 DIAGNOSIS — Z7984 Long term (current) use of oral hypoglycemic drugs: Secondary | ICD-10-CM | POA: Insufficient documentation

## 2016-03-19 DIAGNOSIS — E119 Type 2 diabetes mellitus without complications: Secondary | ICD-10-CM | POA: Diagnosis not present

## 2016-03-19 DIAGNOSIS — K2970 Gastritis, unspecified, without bleeding: Secondary | ICD-10-CM | POA: Diagnosis not present

## 2016-03-19 DIAGNOSIS — I10 Essential (primary) hypertension: Secondary | ICD-10-CM | POA: Insufficient documentation

## 2016-03-19 DIAGNOSIS — Z79899 Other long term (current) drug therapy: Secondary | ICD-10-CM | POA: Insufficient documentation

## 2016-03-19 DIAGNOSIS — J45909 Unspecified asthma, uncomplicated: Secondary | ICD-10-CM | POA: Diagnosis not present

## 2016-03-19 DIAGNOSIS — K219 Gastro-esophageal reflux disease without esophagitis: Secondary | ICD-10-CM | POA: Diagnosis not present

## 2016-03-19 DIAGNOSIS — R1010 Upper abdominal pain, unspecified: Secondary | ICD-10-CM

## 2016-03-19 DIAGNOSIS — R1013 Epigastric pain: Secondary | ICD-10-CM | POA: Diagnosis not present

## 2016-03-19 DIAGNOSIS — K760 Fatty (change of) liver, not elsewhere classified: Secondary | ICD-10-CM | POA: Diagnosis not present

## 2016-03-19 DIAGNOSIS — Z7982 Long term (current) use of aspirin: Secondary | ICD-10-CM | POA: Diagnosis not present

## 2016-03-19 DIAGNOSIS — R1011 Right upper quadrant pain: Secondary | ICD-10-CM | POA: Diagnosis present

## 2016-03-19 DIAGNOSIS — R110 Nausea: Secondary | ICD-10-CM

## 2016-03-19 LAB — URINALYSIS, ROUTINE W REFLEX MICROSCOPIC
Bilirubin Urine: NEGATIVE
GLUCOSE, UA: NEGATIVE mg/dL
Hgb urine dipstick: NEGATIVE
KETONES UR: NEGATIVE mg/dL
LEUKOCYTES UA: NEGATIVE
Nitrite: NEGATIVE
PH: 5 (ref 5.0–8.0)
Protein, ur: NEGATIVE mg/dL
SPECIFIC GRAVITY, URINE: 1.016 (ref 1.005–1.030)

## 2016-03-19 LAB — LIPASE, BLOOD: LIPASE: 22 U/L (ref 11–51)

## 2016-03-19 LAB — I-STAT TROPONIN, ED: Troponin i, poc: 0.01 ng/mL (ref 0.00–0.08)

## 2016-03-19 LAB — COMPREHENSIVE METABOLIC PANEL
ALBUMIN: 4.3 g/dL (ref 3.5–5.0)
ALT: 33 U/L (ref 14–54)
AST: 19 U/L (ref 15–41)
Alkaline Phosphatase: 67 U/L (ref 38–126)
Anion gap: 7 (ref 5–15)
BUN: 13 mg/dL (ref 6–20)
CHLORIDE: 103 mmol/L (ref 101–111)
CO2: 30 mmol/L (ref 22–32)
Calcium: 9.6 mg/dL (ref 8.9–10.3)
Creatinine, Ser: 0.91 mg/dL (ref 0.44–1.00)
GFR calc Af Amer: >60 mL/min (ref >60)
GFR calc non Af Amer: >60 mL/min (ref >60)
GLUCOSE: 106 mg/dL — AB (ref 65–99)
POTASSIUM: 3.6 mmol/L (ref 3.5–5.1)
Sodium: 140 mmol/L (ref 135–145)
Total Bilirubin: 0.9 mg/dL (ref 0.3–1.2)
Total Protein: 7.6 g/dL (ref 6.5–8.1)

## 2016-03-19 LAB — CBC
HEMATOCRIT: 38.4 % (ref 36.0–46.0)
HEMOGLOBIN: 12.9 g/dL (ref 12.0–15.0)
MCH: 27.1 pg (ref 26.0–34.0)
MCHC: 33.6 g/dL (ref 30.0–36.0)
MCV: 80.7 fL (ref 78.0–100.0)
Platelets: 350 10*3/uL (ref 150–400)
RBC: 4.76 MIL/uL (ref 3.87–5.11)
RDW: 13 % (ref 11.5–15.5)
WBC: 8.8 10*3/uL (ref 4.0–10.5)

## 2016-03-19 MED ORDER — FAMOTIDINE IN NACL 20-0.9 MG/50ML-% IV SOLN
20.0000 mg | Freq: Once | INTRAVENOUS | Status: AC
  Administered 2016-03-19: 20 mg via INTRAVENOUS
  Filled 2016-03-19: qty 50

## 2016-03-19 MED ORDER — ONDANSETRON HCL 4 MG/2ML IJ SOLN
4.0000 mg | Freq: Once | INTRAMUSCULAR | Status: AC
  Administered 2016-03-19: 4 mg via INTRAVENOUS
  Filled 2016-03-19: qty 2

## 2016-03-19 MED ORDER — SODIUM CHLORIDE 0.9 % IV BOLUS (SEPSIS)
1000.0000 mL | Freq: Once | INTRAVENOUS | Status: AC
  Administered 2016-03-19: 1000 mL via INTRAVENOUS

## 2016-03-19 MED ORDER — GI COCKTAIL ~~LOC~~
30.0000 mL | Freq: Once | ORAL | Status: AC
  Administered 2016-03-19: 30 mL via ORAL
  Filled 2016-03-19: qty 30

## 2016-03-19 MED ORDER — RANITIDINE HCL 150 MG PO TABS: 150.0000 mg | ORAL_TABLET | Freq: Two times a day (BID) | ORAL | 0 refills | Status: DC

## 2016-03-19 MED ORDER — ONDANSETRON 8 MG PO TBDP: 8.0000 mg | ORAL_TABLET | Freq: Three times a day (TID) | ORAL | 0 refills | Status: DC | PRN

## 2016-03-19 NOTE — ED Triage Notes (Signed)
Pt states she has had abd pain for 2 weeks  PT states she did see her PCP and had a work up including a CT scan that she was told was normal  Pt states she continues to have pain  Pt states she did take colace and has had BMs but the pain continues

## 2016-03-19 NOTE — ED Provider Notes (Signed)
[facility]-EMERGENCY DEPT Provider Note   CSN: [id number] Arrival date & time: 03/19/16  9562     History   Chief Complaint Chief Complaint  Patient presents with  . Abdominal Pain    HPI Kristen Holland is a 51 y.o. female with a PMHx of DM2, HTN, HLD, GERD, asthma, and PSHx of abd hysterectomy and appendectomy, who presents to the ED with complaints of 2 weeks of right upper quadrant abdominal pain and nausea. Pt reports she was seen at [facility] on 03/14/16 for this complaint, chart review reveals she had labs which were WNL, U/A WNL, abd xray showed moderate R colonic stool burden so she was told to use colace and prune juice to produce BMs; states she had moderate stool output but this didn't help her symptoms; last BM was yesterday which was "normal" and soft; she reports she has BMs usually once per 3 days, and denies being constipated. She then went to her PCP and had CT abd on 03/18/16 which showed hepatic steatosis but otherwise unremarkable. Chart review reveals she's had a prior HIDA scan in 03/2014 which was unremarkable. Has not followed up with a surgeon or anyone else for this issue, aside from her PCP and the [facility]. She describes her abdominal pain as 4/10 constant sharp stabbing nonradiating right upper quadrant pain, worse with eating especially tomato-based foods, unrelieved with ibuprofen and mobic, and mildly improved with tramadol. Associated symptoms include nausea especially after eating, and initially she had vomiting 2 weeks ago but she has not had any vomiting since then. She endorses some occasional NSAID use.  She denies fevers, chills, CP, SOB, ongoing vomiting, diarrhea, constipation, obstipation, melena, hematochezia, hematuria, dysuria, flank pain, vaginal bleeding/discharge, myalgias, arthralgias, numbness, tingling, weakness, or any other complaints. Denies sick contacts, recent travel, suspicious food intake, or EtOH use.    The history is provided by the patient and medical  records. No language interpreter was used.  Abdominal Pain   This is a new problem. The current episode started more than 1 week ago. The problem occurs constantly. The problem has not changed since onset.The pain is associated with eating. The pain is located in the RUQ. The quality of the pain is sharp. The pain is at a severity of 4/10. The pain is moderate. Associated symptoms include nausea. Pertinent negatives include fever, diarrhea, flatus, hematochezia, melena, vomiting (none in 2wks), constipation, dysuria, hematuria, arthralgias and myalgias. The symptoms are aggravated by eating. Relieved by: tramadol. Past workup includes CT scan. Her past medical history is significant for GERD.    Past Medical History:  Diagnosis Date  . Abdominal pain   . Allergy   . Arthritis   . Asthma   . Diabetes mellitus without complication (HCC)   . GERD (gastroesophageal reflux disease)   . Hyperlipidemia   . Hypertension   . Nausea & vomiting     Patient Active Problem List   Diagnosis Date Noted  . Diabetes (HCC) 12/29/2011  . RUQ pain 12/29/2011  . Constipation 12/29/2011    Past Surgical History:  Procedure Laterality Date  . ABDOMINAL HYSTERECTOMY    . APPENDECTOMY    . BACK SURGERY      OB History    No data available       Home Medications    Prior to Admission medications   Medication Sig Start Date End Date Taking? Authorizing Provider  albuterol (PROVENTIL HFA;VENTOLIN HFA) 108 (90 BASE) MCG/ACT inhaler Inhale 1-2 puffs into the lungs every 6 (six)  hours as needed for wheezing or shortness of breath. Patient taking differently: Inhale 2 puffs into the lungs every 6 (six) hours as needed for wheezing or shortness of breath.  11/21/13  Yes Mellody DrownLauren Parker, PA-C  aspirin EC 81 MG tablet Take 81 mg by mouth daily.   Yes Historical Provider, MD  beclomethasone (QVAR) 40 MCG/ACT inhaler Inhale 1 puff into the lungs daily.    Yes Historical Provider, MD  dexlansoprazole (DEXILANT)  60 MG capsule Take 60 mg by mouth daily.   Yes Historical Provider, MD  Liraglutide (VICTOZA) 18 MG/3ML SOLN Inject 1.8 mg into the skin every morning.    Yes Historical Provider, MD  meloxicam (MOBIC) 15 MG tablet Take 15 mg by mouth daily.   Yes Historical Provider, MD  metFORMIN (GLUCOPHAGE) 500 MG tablet Take 500 mg by mouth every evening.    Yes Historical Provider, MD  montelukast (SINGULAIR) 10 MG tablet Take 10 mg by mouth every other day.    Yes Historical Provider, MD  valsartan-hydrochlorothiazide (DIOVAN-HCT) 160-12.5 MG per tablet Take 1 tablet by mouth daily.   Yes Historical Provider, MD  Vitamin D, Ergocalciferol, (DRISDOL) 50000 UNITS CAPS capsule Take 50,000 Units by mouth 2 (two) times a week. Tuesday and friday   Yes Historical Provider, MD  ibuprofen (ADVIL,MOTRIN) 800 MG tablet Take 1 tablet (800 mg total) by mouth 3 (three) times daily. Patient not taking: Reported on 03/19/2016 02/08/15   Courteney Lyn Mackuen, MD    Family History Family History  Problem Relation Age of Onset  . Hypertension Mother   . Diabetes Mother   . Cancer Mother   . Cancer Father   . Diabetes Father   . Hypertension Father     Social History Social History  Substance Use Topics  . Smoking status: Never Smoker  . Smokeless tobacco: Never Used  . Alcohol use No     Allergies   Patient has no known allergies.   Review of Systems Review of Systems  Constitutional: Negative for chills and fever.  Respiratory: Negative for shortness of breath.   Cardiovascular: Negative for chest pain.  Gastrointestinal: Positive for abdominal pain and nausea. Negative for blood in stool, constipation, diarrhea, flatus, hematochezia, melena and vomiting (none in 2wks).  Genitourinary: Negative for dysuria, flank pain, hematuria, vaginal bleeding and vaginal discharge.  Musculoskeletal: Negative for arthralgias and myalgias.  Skin: Negative for color change.  Allergic/Immunologic: Positive for  immunocompromised state (DM2).  Neurological: Negative for weakness and numbness.  Psychiatric/Behavioral: Negative for confusion.   10 Systems reviewed and are negative for acute change except as noted in the HPI.   Physical Exam Updated Vital Signs BP 116/69 (BP Location: Left Arm)   Pulse 72   Temp 98.4 F (36.9 C) (Oral)   Resp 20   SpO2 96%   Physical Exam  Constitutional: She is oriented to person, place, and time. Vital signs are normal. She appears well-developed and well-nourished.  Non-toxic appearance. No distress.  Afebrile, nontoxic, NAD  HENT:  Head: Normocephalic and atraumatic.  Mouth/Throat: Oropharynx is clear and moist and mucous membranes are normal.  Eyes: Conjunctivae and EOM are normal. Right eye exhibits no discharge. Left eye exhibits no discharge.  Neck: Normal range of motion. Neck supple.  Cardiovascular: Normal rate, regular rhythm, normal heart sounds and intact distal pulses.  Exam reveals no gallop and no friction rub.   No murmur heard. Pulmonary/Chest: Effort normal and breath sounds normal. No respiratory distress. She has no decreased  breath sounds. She has no wheezes. She has no rhonchi. She has no rales.  Abdominal: Soft. Normal appearance and bowel sounds are normal. She exhibits no distension. There is tenderness in the right upper quadrant and epigastric area. There is positive Murphy's sign. There is no rigidity, no rebound, no guarding, no CVA tenderness and no tenderness at McBurney's point.    Soft, obese but nondistended, +BS throughout, with mild epigastric and RUQ TTP, no r/g/r, +murphy's, neg mcburney's, no CVA TTP   Musculoskeletal: Normal range of motion.  Neurological: She is alert and oriented to person, place, and time. She has normal strength. No sensory deficit.  Skin: Skin is warm, dry and intact. No rash noted.  Psychiatric: She has a normal mood and affect.  Nursing note and vitals reviewed.    ED Treatments / Results    Labs (all labs ordered are listed, but only abnormal results are displayed) Labs Reviewed  COMPREHENSIVE METABOLIC PANEL - Abnormal; Notable for the following:       Result Value   Glucose, Bld 106 (*)    All other components within normal limits  LIPASE, BLOOD  CBC  URINALYSIS, ROUTINE W REFLEX MICROSCOPIC  I-STAT TROPOININ, ED    EKG  EKG Interpretation  Date/Time:  Saturday March 19 2016 08:32:59 EST Ventricular Rate:  69 PR Interval:    QRS Duration: 99 QT Interval:  403 QTC Calculation: 432 R Axis:   50 Text Interpretation:  Sinus rhythm Borderline T abnormalities, anterior leads Confirmed by Fayrene Fearing  MD, MARK ([id number]) on 03/19/2016 9:15:27 AM       Radiology Ct Abdomen Pelvis Wo Contrast  Result Date: 03/18/2016 CLINICAL DATA:  RUQ ABDOMINAL PAIN x 2WKSSX PARTIAL HYSTERECTOMYNO HX CA EXAM: CT ABDOMEN AND PELVIS WITHOUT CONTRAST TECHNIQUE: Multidetector CT imaging of the abdomen and pelvis was performed following the standard protocol without IV contrast. COMPARISON:  CT, 10/06/2014 FINDINGS: Lower chest: No acute abnormality. Hepatobiliary: Low-attenuation of the liver consistent with fatty infiltration. No liver mass or focal lesion. Normal gallbladder. No bile duct dilation. Pancreas: Unremarkable. No pancreatic ductal dilatation or surrounding inflammatory changes. Spleen: Normal in size without focal abnormality. Adrenals/Urinary Tract: Adrenal glands are unremarkable. Kidneys are normal, without renal calculi, focal lesion, or hydronephrosis. Bladder is unremarkable. Stomach/Bowel: Stomach is within normal limits. Appendix appears normal. No evidence of bowel wall thickening, distention, or inflammatory changes. Vascular/Lymphatic: No significant vascular findings are present. No enlarged abdominal or pelvic lymph nodes. Reproductive: Status post hysterectomy. No adnexal masses. Other: No abdominal wall hernia or abnormality. No abdominopelvic ascites. Musculoskeletal: No  fracture or acute finding. Disc degenerative changes at L5-S1. No osteoblastic or osteolytic lesions. IMPRESSION: 1. No acute findings. 2. Hepatic steatosis. 3. Status post hysterectomy. Disk degenerative changes at L5-S1. No other abnormalities. Electronically Signed   By: Amie Portland M.D.   On: 03/18/2016 12:43   US Abdomen Complete  Result Date: 03/19/2016 CLINICAL DATA:  Right upper quadrant abdominal pain for the past 2 weeks with a clinically positive Murphy's sign. EXAM: ABDOMEN ULTRASOUND COMPLETE COMPARISON:  Abdomen CT dated 03/18/2017. FINDINGS: Gallbladder: No gallstones or wall thickening visualized. No sonographic Murphy sign noted by sonographer. Common bile duct: Diameter: 3.9 mm Liver: Diffusely echogenic. Corresponding low density on the previous CT. IVC: No abnormality visualized. Pancreas: Visualized portion unremarkable. Spleen: Size and appearance within normal limits. Right Kidney: Length: 11.3 cm. Echogenicity within normal limits. No mass or hydronephrosis visualized. Left Kidney: Length: 11.9 cm. Echogenicity within normal limits. No mass or  hydronephrosis visualized. Abdominal aorta: No aneurysm visualized. Other findings: None. IMPRESSION: Diffuse hepatic steatosis.  Otherwise, normal examination. Electronically Signed   By: Beckie Salts M.D.   On: 03/19/2016 09:42   03/14/16 Abd Xray 2 View Study Result  CLINICAL DATA:  Right upper quadrant pain for 1 week  EXAM: ABDOMEN - 2 VIEW  COMPARISON:  10/06/2014 CT scan  FINDINGS: There is normal small bowel gas pattern. No evidence of free abdominal air. Moderate stool noted in right colon. Some colonic stool noted in descending colon.  IMPRESSION: Normal small bowel gas pattern. Moderate stool noted in right colon.   Electronically Signed   By: Natasha Mead M.D.   On: 03/14/2016 09:02    HIDA scan 03/18/14 Study Result  CLINICAL DATA:  Right upper quadrant pain and nausea.  EXAM: NUCLEAR MEDICINE  HEPATOBILIARY IMAGING WITH GALLBLADDER EF  TECHNIQUE: Sequential images of the abdomen were obtained out to 60 minutes following intravenous administration of radiopharmaceutical. After slow intravenous infusion of 2.04 micrograms Cholecystokinin, gallbladder ejection fraction was determined.  RADIOPHARMACEUTICALS:  Five [employer] Tc-44m Choletec  COMPARISON:  Abdominal ultrasound of March 12, 2014.  FINDINGS: There is adequate uptake of the radiopharmaceutical by the liver. The gallbladder and common bile duct are visible by 15 min. Bowel activity is clearly evident by 45 min. Following infusion of sincalide the gallbladder ejection fraction is 72% at 30 min. At 30 min, normal ejection fraction is expected to be greater than 30%.  The patient did not experience symptoms during CCK infusion.  IMPRESSION: Normal hepatobiliary scan with normal gallbladder ejection fraction.   Electronically Signed   By: David  [place]   On: 03/18/2014 11:05     Procedures Procedures (including critical care time)  Medications Ordered in ED Medications  gi cocktail (Maalox,Lidocaine,Donnatal) (30 mLs Oral Given 03/19/16 0800)  famotidine (PEPCID) IVPB 20 mg premix (0 mg Intravenous Stopped 03/19/16 0911)  ondansetron (ZOFRAN) injection 4 mg (4 mg Intravenous Given 03/19/16 0811)  sodium chloride 0.9 % bolus 1,000 mL (0 mLs Intravenous Stopped 03/19/16 0926)     Initial Impression / Assessment and Plan / ED Course  I have reviewed the triage vital signs and the nursing notes.  Pertinent labs & imaging results that were available during my care of the patient were reviewed by me and considered in my medical decision making (see chart for details).  Clinical Course     51 y.o. female here with ongoing RUQ pain x2 weeks and nausea; vomiting 2wks ago but none since then. Was seen at [facility] and had labs which were WNL, abd xray that showed moderate stool burden in R colon; took colace and  prune juice and reports moderate amount of stool output without relief; denies being constipated, although states 1 BM every 3 days is her normal. Then saw PCP, had CT abd/pelvis on 03/18/16 which showed hepatic steatosis but otherwise unremarkable. Has had a HIDA scan in 2016 which was negative. On exam, moderate RUQ and epigastric TTP, +murphy's exam, otherwise nonperitoneal. Labs ordered in triage are pending; will proceed with abd U/S since this is the only study that hasn't been done recently; will also add-on troponin and EKG to r/o any cardiac etiology; will give zofran, fluids, pepcid, and GI cocktail and reassess after. DDx includes gastritis vs PUD/GERD vs gallbladder pathology, etc but will see what work up shows.  10:36 AM CBC WNL, CMP WNL, lipase WNL, U/A unremarkable. Trop neg. EKG without acute ischemic findings and unchanged from prior.  U/S unremarkable aside from hepatic steatosis. Pt feeling slightly better after meds, nausea improved, tolerating PO well. Symptoms most c/w gastritis/GERD/PUD, advised pt to continue dexilant, start zantac daily; rx zofran given; GERD diet modifications advised, PRN tums/maalox as needed for additional relief. Also advised miralax PRN and increased fiber/water intake to achieve  daily soft stools in order to eliminate that as a possible etiology; f/up with GI for likely EGD to further eval this issue; also f/up with PCP in 1wk. I explained the diagnosis and have given explicit precautions to return to the ER including for any other new or worsening symptoms. The patient understands and accepts the medical plan as it's been dictated and I have answered their questions. Discharge instructions concerning home care and prescriptions have been given. The patient is STABLE and is discharged to home in good condition.   Final Clinical Impressions(s) / ED Diagnoses   Final diagnoses:  Upper abdominal pain  Nausea  Gastritis, presence of bleeding unspecified,  unspecified chronicity, unspecified gastritis type  Gastroesophageal reflux disease, esophagitis presence not specified  Hepatic steatosis    New Prescriptions New Prescriptions   ONDANSETRON (ZOFRAN ODT) 8 MG DISINTEGRATING TABLET    Take 1 tablet (8 mg total) by mouth every 8 (eight) hours as needed for nausea or vomiting.   RANITIDINE (ZANTAC) 150 MG TABLET    Take 1 tablet (150 mg total) by mouth 2 (two) times daily.       [address], PA-C 03/19/16 1038    Blane Ohara, MD 03/19/16 [id number]

## 2016-03-19 NOTE — Discharge Instructions (Signed)
Your abdominal pain is likely from gastritis or an ulcer. You will need to start taking zantac as directed, and continue taking your home dexilant. Avoid spicy/fatty/acidic foods (such as tomato based products, etc), avoid soda/coffee/tea/alcohol. Avoid laying down flat within 30 minutes of eating. Avoid NSAIDs like ibuprofen/aleve/motrin/etc on an empty stomach. May consider using over the counter tums/maalox as needed for additional relief. Use zofran as directed as needed for nausea. Use tylenol as needed for pain. You may want to consider increasing fiber and water intake in your diet and try taking miralax as needed in order to achieve daily soft bowel movements. Follow up with the gastroenterologist and your regular doctor in one week for ongoing evaluation of your abdominal pain and recheck of symptoms. Return to the ER for changes or worsening symptoms.  Abdominal (belly) pain can be caused by many things. Your caregiver performed an examination and possibly ordered blood/urine tests and imaging (CT scan, x-rays, ultrasound). Many cases can be observed and treated at home after initial evaluation in the emergency department. Even though you are being discharged home, abdominal pain can be unpredictable. Therefore, you need a repeated exam if your pain does not resolve, returns, or worsens. Most patients with abdominal pain don't have to be admitted to the hospital or have surgery, but serious problems like appendicitis and gallbladder attacks can start out as nonspecific pain. Many abdominal conditions cannot be diagnosed in one visit, so follow-up evaluations are very important. SEEK IMMEDIATE MEDICAL ATTENTION IF YOU DEVELOP ANY OF THE FOLLOWING SYMPTOMS: The pain does not go away or becomes severe.  A temperature above 101 develops.  Repeated vomiting occurs (multiple episodes).  The pain becomes localized to portions of the abdomen. The right side could possibly be appendicitis. In an adult, the  left lower portion of the abdomen could be colitis or diverticulitis.  Blood is being passed in stools or vomit (bright red or black tarry stools).  Return also if you develop chest pain, difficulty breathing, dizziness or fainting, or become confused, poorly responsive, or inconsolable (young children). The constipation stays for more than 4 days.  There is belly (abdominal) or rectal pain.  You do not seem to be getting better.

## 2016-03-21 ENCOUNTER — Other Ambulatory Visit: Payer: Self-pay | Admitting: *Deleted

## 2016-03-21 DIAGNOSIS — R945 Abnormal results of liver function studies: Principal | ICD-10-CM

## 2016-03-21 DIAGNOSIS — R7989 Other specified abnormal findings of blood chemistry: Secondary | ICD-10-CM

## 2016-03-22 DIAGNOSIS — K219 Gastro-esophageal reflux disease without esophagitis: Secondary | ICD-10-CM | POA: Diagnosis not present

## 2016-03-22 DIAGNOSIS — R140 Abdominal distension (gaseous): Secondary | ICD-10-CM | POA: Diagnosis not present

## 2016-03-22 DIAGNOSIS — K5904 Chronic idiopathic constipation: Secondary | ICD-10-CM | POA: Diagnosis not present

## 2016-03-31 DIAGNOSIS — B349 Viral infection, unspecified: Secondary | ICD-10-CM | POA: Diagnosis not present

## 2016-03-31 DIAGNOSIS — R05 Cough: Secondary | ICD-10-CM | POA: Diagnosis not present

## 2016-04-13 ENCOUNTER — Ambulatory Visit (HOSPITAL_COMMUNITY)
Admission: EM | Admit: 2016-04-13 | Discharge: 2016-04-13 | Disposition: A | Discharge status: Home / Self Care | Payer: Worker's Compensation | Attending: Family Medicine | Admitting: Family Medicine

## 2016-04-13 ENCOUNTER — Encounter (HOSPITAL_COMMUNITY): Payer: Self-pay | Admitting: Emergency Medicine

## 2016-04-13 DIAGNOSIS — W19XXXA Unspecified fall, initial encounter: Secondary | ICD-10-CM | POA: Diagnosis not present

## 2016-04-13 DIAGNOSIS — S300XXA Contusion of lower back and pelvis, initial encounter: Secondary | ICD-10-CM | POA: Diagnosis not present

## 2016-04-13 DIAGNOSIS — S46912A Strain of unspecified muscle, fascia and tendon at shoulder and upper arm level, left arm, initial encounter: Secondary | ICD-10-CM

## 2016-04-13 MED ORDER — DICLOFENAC SODIUM 75 MG PO TBEC: 75.0000 mg | DELAYED_RELEASE_TABLET | Freq: Two times a day (BID) | ORAL | 0 refills | Status: DC

## 2016-04-13 MED ORDER — TRAMADOL HCL 50 MG PO TABS: 50.0000 mg | ORAL_TABLET | Freq: Four times a day (QID) | ORAL | 0 refills | Status: DC | PRN

## 2016-04-13 NOTE — Discharge Instructions (Signed)
Please follow-up with your primary care doctor in 2 days.

## 2016-04-13 NOTE — ED Provider Notes (Signed)
[facility]    CSN: [id number] Arrival date & time: 04/13/16  1901     History   Chief Complaint Chief Complaint  Patient presents with  . Fall    HPI Kristen Holland is a 51 y.o. female.   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  Patient was slipping as she descended the steps and she grabbed the railing which pulled her arm back as she slid down and struck her left low back. She is able to carry on her duties but has gradually gotten more more discomfort as the days progressed.      Past Medical History:  Diagnosis Date  . Abdominal pain   . Allergy   . Arthritis   . Asthma   . Diabetes mellitus without complication (HCC)   . GERD (gastroesophageal reflux disease)   . Hyperlipidemia   . Hypertension   . Nausea & vomiting     Patient Active Problem List   Diagnosis Date Noted  . Diabetes (HCC) 12/29/2011  . RUQ pain 12/29/2011  . Constipation 12/29/2011    Past Surgical History:  Procedure Laterality Date  . ABDOMINAL HYSTERECTOMY    . APPENDECTOMY    . BACK SURGERY      OB History    No data available       Home Medications    Prior to Admission medications   Medication Sig Start Date End Date Taking? Authorizing Provider  albuterol (PROVENTIL HFA;VENTOLIN HFA) 108 (90 BASE) MCG/ACT inhaler Inhale 1-2 puffs into the lungs every 6 (six) hours as needed for wheezing or shortness of breath. Patient taking differently: Inhale 2 puffs into the lungs every 6 (six) hours as needed for wheezing or shortness of breath.  11/21/13   Mellody Drown, PA-C  aspirin EC 81 MG tablet Take 81 mg by mouth daily.    Historical Provider, MD  atorvastatin (LIPITOR) 40 MG tablet Take 40 mg by mouth daily. 10/31/14   Historical Provider, MD  beclomethasone (QVAR) 40 MCG/ACT inhaler Inhale 1 puff into the lungs daily.     Historical Provider, MD  dexlansoprazole (DEXILANT) 60 MG capsule Take 60  mg by mouth daily.    Historical Provider, MD  diclofenac (VOLTAREN) 75 MG EC tablet Take 1 tablet (75 mg total) by mouth 2 (two) times daily. 04/13/16   Elvina Sidle, MD  Lifitegrast Benay Spice) 5 % SOLN Place 1 drop into both eyes daily.    Historical Provider, MD  Liraglutide (VICTOZA) 18 MG/3ML SOLN Inject 1.8 mg into the skin every morning.     Historical Provider, MD  meloxicam (MOBIC) 15 MG tablet Take 15 mg by mouth daily.    Historical Provider, MD  metFORMIN (GLUCOPHAGE-XR) 500 MG 24 hr tablet Take 500 mg by mouth daily. 03/03/16   Historical Provider, MD  montelukast (SINGULAIR) 10 MG tablet Take 10 mg by mouth every other day.     Historical Provider, MD  ondansetron (ZOFRAN ODT) 8 MG disintegrating tablet Take 1 tablet (8 mg total) by mouth every 8 (eight) hours as needed for nausea or vomiting. 03/19/16   Mercedes Street, PA-C  ranitidine (ZANTAC) 150 MG tablet Take 1 tablet (150 mg total) by mouth 2 (two) times daily. 03/19/16   Mercedes Street, PA-C  traMADol (ULTRAM) 50 MG tablet Take 1 tablet (50 mg total) by mouth every 6 (six) hours as needed. 04/13/16   Elvina Sidle, MD  valsartan-hydrochlorothiazide (DIOVAN-HCT) 160-12.5 MG per tablet Take 1 tablet by mouth daily.    Historical Provider, MD  Vitamin D, Ergocalciferol, (DRISDOL) 50000 UNITS CAPS capsule Take 50,000 Units by mouth 2 (two) times a week. Tuesday and friday    Historical Provider, MD    Family History Family History  Problem Relation Age of Onset  . Hypertension Mother   . Diabetes Mother   . Cancer Mother   . Cancer Father   . Diabetes Father   . Hypertension Father     Social History Social History  Substance Use Topics  . Smoking status: Never Smoker  . Smokeless tobacco: Never Used  . Alcohol use No     Allergies   Patient has no known allergies.   Review of Systems Review of Systems  Constitutional: Negative.   HENT: Negative.   Respiratory: Negative.   Musculoskeletal: Positive for back  pain. Negative for neck pain and neck stiffness.  Neurological: Negative.      Physical Exam Triage Vital Signs ED Triage Vitals [04/13/16 1954]  Enc Vitals Group     BP 120/59     Pulse Rate 90     Resp 18     Temp 98 F (36.7 C)     Temp Source Oral     SpO2 98 %     Weight      Height      Head Circumference      Peak Flow      Pain Score 6     Pain Loc      Pain Edu?      Excl. in GC?    No data found.   Updated Vital Signs BP 120/59 (BP Location: Right Arm) Comment (BP Location): large cuff  Pulse 90   Temp 98 F (36.7 C) (Oral)   Resp 18   SpO2 98%   Physical Exam  Constitutional: She is oriented to person, place, and time. She appears well-developed and well-nourished.  HENT:  Head: Normocephalic.  Right Ear: External ear normal.  Left Ear: External ear normal.  Mouth/Throat: Oropharynx is clear and moist.  Eyes: Conjunctivae are normal. Pupils are equal, round, and reactive to light.  Neck: Normal range of motion. Neck supple.  Pulmonary/Chest: Effort normal.  Musculoskeletal: She exhibits tenderness.  Tender left anterior shoulder joint line. Patient has good range of motion of her left upper arm. Patient has good range of motion of her left hip.  Patient has no ecchymosis or swelling or skin breaks on her left shoulder or back. She is mildly tender in her left flank. Her gait is stable.  Neurological: She is alert and oriented to person, place, and time.  Skin: Skin is warm and dry.  Nursing note and vitals reviewed.    UC Treatments / Results  Labs (all labs ordered are listed, but only abnormal results are displayed) Labs Reviewed - No data to display  EKG  EKG Interpretation None       Radiology No results found.  Procedures Procedures (including critical care time)  Medications Ordered in UC Medications - No data to display   Initial Impression / Assessment and Plan / UC Course  I have reviewed the triage vital signs and the  nursing notes.  Pertinent labs & imaging results that were available during my care of the patient were reviewed by me and considered in my medical decision making (see chart for details).     Final Clinical Impressions(s) / UC  Diagnoses   Final diagnoses:  Shoulder strain, left, initial encounter  Lumbar contusion, initial encounter  Fall, initial encounter    New Prescriptions New Prescriptions   DICLOFENAC (VOLTAREN) 75 MG EC TABLET    Take 1 tablet (75 mg total) by mouth 2 (two) times daily.   TRAMADOL (ULTRAM) 50 MG TABLET    Take 1 tablet (50 mg total) by mouth every 6 (six) hours as needed.     Elvina SidleKurt Laqueta Bonaventura, MD 04/13/16 2034

## 2016-04-13 NOTE — ED Triage Notes (Signed)
See s/s.  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  Pain in left shoulder, lower back.  Patient reports landing on buttocks, but was holding onto the railing with left arm

## 2016-05-10 DIAGNOSIS — I10 Essential (primary) hypertension: Secondary | ICD-10-CM | POA: Diagnosis not present

## 2016-05-10 DIAGNOSIS — E119 Type 2 diabetes mellitus without complications: Secondary | ICD-10-CM | POA: Diagnosis not present

## 2016-05-10 DIAGNOSIS — E7800 Pure hypercholesterolemia, unspecified: Secondary | ICD-10-CM | POA: Diagnosis not present

## 2016-07-20 DIAGNOSIS — M79606 Pain in leg, unspecified: Secondary | ICD-10-CM | POA: Diagnosis not present

## 2016-07-20 DIAGNOSIS — M25519 Pain in unspecified shoulder: Secondary | ICD-10-CM | POA: Diagnosis not present

## 2016-09-15 DIAGNOSIS — M549 Dorsalgia, unspecified: Secondary | ICD-10-CM | POA: Diagnosis not present

## 2016-11-17 DIAGNOSIS — E1165 Type 2 diabetes mellitus with hyperglycemia: Secondary | ICD-10-CM | POA: Diagnosis not present

## 2016-11-17 DIAGNOSIS — I10 Essential (primary) hypertension: Secondary | ICD-10-CM | POA: Diagnosis not present

## 2016-11-17 DIAGNOSIS — E7800 Pure hypercholesterolemia, unspecified: Secondary | ICD-10-CM | POA: Diagnosis not present

## 2016-11-17 DIAGNOSIS — Z23 Encounter for immunization: Secondary | ICD-10-CM | POA: Diagnosis not present

## 2017-03-01 DIAGNOSIS — G4733 Obstructive sleep apnea (adult) (pediatric): Secondary | ICD-10-CM | POA: Diagnosis not present

## 2017-04-18 DIAGNOSIS — R079 Chest pain, unspecified: Secondary | ICD-10-CM | POA: Diagnosis not present

## 2017-04-26 ENCOUNTER — Telehealth: Payer: Self-pay | Admitting: Cardiovascular Disease

## 2017-04-26 NOTE — Telephone Encounter (Signed)
Received incoming [facility] for upcoming appointment on 05/11/17 @ 11:40am with Dr. Royann Shiversroitoru. Records located in Medical Records. 04/26/17 ab

## 2017-05-11 ENCOUNTER — Encounter: Payer: Self-pay | Admitting: Cardiovascular Disease

## 2017-05-11 ENCOUNTER — Ambulatory Visit (INDEPENDENT_AMBULATORY_CARE_PROVIDER_SITE_OTHER): Payer: BC Managed Care – PPO | Admitting: Cardiovascular Disease

## 2017-05-11 VITALS — BP 151/91 | HR 72 | Ht 65.0 in | Wt 236.4 lb

## 2017-05-11 DIAGNOSIS — E7800 Pure hypercholesterolemia, unspecified: Secondary | ICD-10-CM

## 2017-05-11 DIAGNOSIS — E669 Obesity, unspecified: Secondary | ICD-10-CM | POA: Diagnosis not present

## 2017-05-11 DIAGNOSIS — E1169 Type 2 diabetes mellitus with other specified complication: Secondary | ICD-10-CM | POA: Diagnosis not present

## 2017-05-11 DIAGNOSIS — R072 Precordial pain: Secondary | ICD-10-CM | POA: Diagnosis not present

## 2017-05-11 DIAGNOSIS — I10 Essential (primary) hypertension: Secondary | ICD-10-CM | POA: Diagnosis not present

## 2017-05-11 DIAGNOSIS — R0602 Shortness of breath: Secondary | ICD-10-CM

## 2017-05-11 DIAGNOSIS — Z8249 Family history of ischemic heart disease and other diseases of the circulatory system: Secondary | ICD-10-CM | POA: Diagnosis not present

## 2017-05-11 DIAGNOSIS — Z6839 Body mass index (BMI) 39.0-39.9, adult: Secondary | ICD-10-CM

## 2017-05-11 MED ORDER — HYDROCHLOROTHIAZIDE 25 MG PO TABS: 25.0000 mg | ORAL_TABLET | Freq: Every day | ORAL | 3 refills | Status: DC

## 2017-05-11 NOTE — Progress Notes (Signed)
Cardiology Consultation Note:    Date:  05/13/2017   ID:  Kristen Holland, DOB 1966/02/24, MRN [id number]  PCP:  Milus Height, PA-C  Cardiologist:  No primary care provider on file.   Referring MD: Milus Height, PA-C   Chief Complaint  Patient presents with  . New Patient (Initial Visit)  Kristen Holland is a 52 y.o. female who is being seen today for the evaluation of chest pain, hypertension and shortness of breath at the request of Redmon, Noelle, PA-C.   History of Present Illness:    Kristen Holland is a 52 y.o. female with a hx of hypertension and obesity, who complains of chest pain and dyspnea.  She describes bilateral sharp shoulder pain which occurs randomly, not associated with physical activity and lasting for about an hour at a time.  It is not worsened by moving the shoulder joints and is not positional.  At that time she describes a feeling of "indigestion" also not associated with physical activity.  She describes significant shortness of breath with relatively light activity such as walking just about 10 steps.  She also becomes very short of breath if she bends over.  She denies syncope, palpitations, lower extremity edema, intermittent claudication, focal neurological complaints.  She has not had any bleeding problems.  She has a variety of aches and pains in her joints.  She has been on blood pressure medications since youth.  She reports that she was well controlled on Diovan HCT, but after the recent recall of the medication has been placed on losartan monotherapy.  Her blood pressure today is poorly controlled.  She is severely obese, almost morbidly obese and has diabetes mellitus on metformin and Victoza.  She takes a statin for hyperlipidemia.  She takes nonsteroidal anti-inflammatory drugs daily.  She had a treadmill nuclear stress test in 2015.  She had normal nuclear perfusion images with an ejection fraction of 64%.  Her son, Kristen Holland, has severe dilated cardiomyopathy  and sees Dr. Gala Romney.  Kristen Holland drives a schoolbus.  Past Medical History:  Diagnosis Date  . Abdominal pain   . Allergy   . Arthritis   . Asthma   . Diabetes mellitus without complication (HCC)   . GERD (gastroesophageal reflux disease)   . Hyperlipidemia   . Hypertension   . Nausea & vomiting     Past Surgical History:  Procedure Laterality Date  . ABDOMINAL HYSTERECTOMY    . APPENDECTOMY    . BACK SURGERY      Current Medications: Current Meds  Medication Sig  . albuterol (PROVENTIL HFA;VENTOLIN HFA) 108 (90 BASE) MCG/ACT inhaler Inhale 1-2 puffs into the lungs every 6 (six) hours as needed for wheezing or shortness of breath. (Patient taking differently: Inhale 2 puffs into the lungs every 6 (six) hours as needed for wheezing or shortness of breath. )  . aspirin EC 81 MG tablet Take 81 mg by mouth daily.  Marland Kitchen atorvastatin (LIPITOR) 40 MG tablet Take 40 mg by mouth daily.  . beclomethasone (QVAR) 40 MCG/ACT inhaler Inhale 1 puff into the lungs daily.   Marland Kitchen dexlansoprazole (DEXILANT) 60 MG capsule Take 60 mg by mouth daily.  . diclofenac (VOLTAREN) 75 MG EC tablet Take 1 tablet (75 mg total) by mouth 2 (two) times daily.  Marland Kitchen Lifitegrast (XIIDRA) 5 % SOLN Place 1 drop into both eyes daily.  . Liraglutide (VICTOZA) 18 MG/3ML SOLN Inject 1.8 mg into the skin every morning.   Marland Kitchen losartan (COZAAR) 100 MG tablet  Take 100 mg by mouth daily.  . meloxicam (MOBIC) 15 MG tablet Take 15 mg by mouth daily.  . metFORMIN (GLUCOPHAGE-XR) 500 MG 24 hr tablet Take 500 mg by mouth daily.  . montelukast (SINGULAIR) 10 MG tablet Take 10 mg by mouth every other day.   . ondansetron (ZOFRAN ODT) 8 MG disintegrating tablet Take 1 tablet (8 mg total) by mouth every 8 (eight) hours as needed for nausea or vomiting.  . ranitidine (ZANTAC) 150 MG tablet Take 1 tablet (150 mg total) by mouth 2 (two) times daily.  . traMADol (ULTRAM) 50 MG tablet Take 1 tablet (50 mg total) by mouth every 6 (six) hours as  needed.  . Vitamin D, Ergocalciferol, (DRISDOL) 50000 UNITS CAPS capsule Take 50,000 Units by mouth 2 (two) times a week. Tuesday and friday  . [DISCONTINUED] valsartan-hydrochlorothiazide (DIOVAN-HCT) 160-12.5 MG per tablet Take 1 tablet by mouth daily.     Allergies:   Patient has no known allergies.   Social History   Socioeconomic History  . Marital status: Married    Spouse name: None  . Number of children: None  . Years of education: None  . Highest education level: None  Social Needs  . Financial resource strain: None  . Food insecurity - worry: None  . Food insecurity - inability: None  . Transportation needs - medical: None  . Transportation needs - non-medical: None  Occupational History  . None  Tobacco Use  . Smoking status: Never Smoker  . Smokeless tobacco: Never Used  Substance and Sexual Activity  . Alcohol use: No  . Drug use: No  . Sexual activity: No    Birth control/protection: Surgical  Other Topics Concern  . None  Social History Narrative  . None     Family History: The patient's family history includes Cancer in her father and mother; Diabetes in her father and mother; Heart failure in her child; Heart murmur in her sister; Hypertension in her father and mother.  Dilated cardiomyopathy in her son  ROS:   Please see the history of present illness.     All other systems reviewed and are negative.  EKGs/Labs/Other Studies Reviewed:    The following studies were reviewed today: Nuclear images from 2015  EKG:  EKG is ordered today.  The ekg ordered today demonstrates normal sinus rhythm, nonspecific T wave flattening in leads V3-V6 and aVF, normal QTC 420 ms.  Recent Labs: Creatinine 0.8, hemoglobin A1c 6.3%, potassium 4.0, hemoglobin 12.9 Recent Lipid Panel Total cholesterol 144, HDL 41, LDL 93, triglycerides 52  Physical Exam:    VS:  BP (!) 151/91   Pulse 72   Ht 5\' 5"  (1.651 m)   Wt 236 lb 6.4 oz (107.2 kg)   BMI 39.34 kg/m      Recheck blood pressure was even higher at 159/96 Wt Readings from Last 3 Encounters:  05/11/17 236 lb 6.4 oz (107.2 kg)  03/14/16 231 lb 12.8 oz (105.1 kg)  02/08/15 230 lb (104.3 kg)     GEN: Severely obese, well nourished, well developed in no acute distress HEENT: Normal NECK: No JVD; No carotid bruits LYMPHATICS: No lymphadenopathy CARDIAC: RRR, no murmurs, rubs, gallops RESPIRATORY:  Clear to auscultation without rales, wheezing or rhonchi  ABDOMEN: Soft, non-tender, non-distended MUSCULOSKELETAL:  No edema; No deformity  SKIN: Warm and dry NEUROLOGIC:  Alert and oriented x 3 PSYCHIATRIC:  Normal affect   ASSESSMENT:    1. Precordial chest pain   2. Shortness  of breath   3. Essential hypertension   4. Diabetes mellitus type 2 in obese (HCC)   5. Hypercholesterolemia   6. Class 2 severe obesity due to excess calories with serious comorbidity and body mass index (BMI) of 39.0 to 39.9 in adult (HCC)   7. Family history of CHF (congestive heart failure)    PLAN:    In order of problems listed above:  1. Chest pain: This is atypical and I suspect it is noncardiac.  However she has numerous risk factors including poorly controlled high blood pressure, hyperlipidemia and type 2 diabetes mellitus.  We will get a stress echocardiogram. 2. Dyspnea: bendopnea suggests CHF.  Check echocardiogram. 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 3. HTN: When she was switched from Diovan to losartan, the diuretic component was not continued.  Will restart a diuretic.  Blood pressure is poorly controlled.  Target BP under 130/80.  Discussed the adverse effects of NSAIDs on her blood pressure and kidney function.  Advised that she use these sparingly, not daily. 4. DM: On metformin and Victoza.  If additional medications are necessary for glycemic control, Jardiance would be a good choice to assist with weight loss and treatment of suspected congestive heart failure.  Currently, control  is good, most recent hemoglobin A1c was 6.3%. 5. HLP: On statin. Has surprisingly low triglyceride level.  In the absence of CAD or PAD, current LDL cholesterol level of 93 is in acceptable range. 6. Obesity: Weight loss is to be strongly encouraged. 7. FHx CMP: Her son'ss cardiomyopathy is presumed to be post viral.  There is no strong family history of CHF.   Medication Adjustments/Labs and Tests Ordered: Current medicines are reviewed at length with the patient today.  Concerns regarding medicines are outlined above.  Orders Placed This Encounter  Procedures  . EKG 12-Lead  . ECHOCARDIOGRAM COMPLETE  . ECHOCARDIOGRAM STRESS TEST   Meds ordered this encounter  Medications  . hydrochlorothiazide (HYDRODIURIL) 25 MG tablet    Sig: Take 1 tablet (25 mg total) by mouth daily.    Dispense:  90 tablet    Refill:  3    Signed, Thurmon FairMihai Lekeya Rollings, MD  05/13/2017 12:37 PM    [facility]

## 2017-05-11 NOTE — Patient Instructions (Signed)
Dr Royann Shiversroitoru has recommended making the following medication changes: 1. START Hydrochlorothiazide 25 mg - take 1 tablet daily  Your physician has requested that you have an echocardiogram. Echocardiography is a painless test that uses sound waves to create images of your heart. It provides your doctor with information about the size and shape of your heart and how well your heart's chambers and valves are working. This procedure takes approximately one hour. There are no restrictions for this procedure.  Your physician has requested that you have a stress echocardiogram. For further information please visit [email or web address]. Please follow instruction sheet as given.  >>These tests will be performed at our [facility] location [address], [address] [phone]  Dr Royann Shiversroitoru recommends that you schedule a follow-up appointment in 3-4 months.  If you need a refill on your cardiac medications before your next appointment, please call your pharmacy.

## 2017-05-13 DIAGNOSIS — E1169 Type 2 diabetes mellitus with other specified complication: Secondary | ICD-10-CM | POA: Insufficient documentation

## 2017-05-13 DIAGNOSIS — Z8249 Family history of ischemic heart disease and other diseases of the circulatory system: Secondary | ICD-10-CM | POA: Insufficient documentation

## 2017-05-13 DIAGNOSIS — I10 Essential (primary) hypertension: Secondary | ICD-10-CM | POA: Insufficient documentation

## 2017-05-13 DIAGNOSIS — E7800 Pure hypercholesterolemia, unspecified: Secondary | ICD-10-CM | POA: Insufficient documentation

## 2017-05-13 DIAGNOSIS — E669 Obesity, unspecified: Secondary | ICD-10-CM | POA: Insufficient documentation

## 2017-05-23 DIAGNOSIS — Z0000 Encounter for general adult medical examination without abnormal findings: Secondary | ICD-10-CM | POA: Diagnosis not present

## 2017-05-23 DIAGNOSIS — E7800 Pure hypercholesterolemia, unspecified: Secondary | ICD-10-CM | POA: Diagnosis not present

## 2017-05-23 DIAGNOSIS — I10 Essential (primary) hypertension: Secondary | ICD-10-CM | POA: Diagnosis not present

## 2017-05-23 DIAGNOSIS — E119 Type 2 diabetes mellitus without complications: Secondary | ICD-10-CM | POA: Diagnosis not present

## 2017-05-25 ENCOUNTER — Telehealth (HOSPITAL_COMMUNITY): Payer: Self-pay | Admitting: *Deleted

## 2017-05-25 NOTE — Telephone Encounter (Signed)
Left message on voicemail per DPR in reference to upcoming appointment scheduled on 06/01/17 at 2:30 with detailed instructions given per Stress Test Requisition Sheet for the test. LM to arrive 30 minutes early, and that it is imperative to arrive on time for appointment to keep from having the test rescheduled. If you need to cancel or reschedule your appointment, please call the office within 24 hours of your appointment. Failure to do so may result in a cancellation of your appointment, and a $50 no show fee. Phone number given for call back for any questions. Daneil DolinSharon S Holland

## 2017-06-01 ENCOUNTER — Ambulatory Visit (HOSPITAL_COMMUNITY): Payer: BC Managed Care – PPO

## 2017-06-01 ENCOUNTER — Other Ambulatory Visit: Payer: Self-pay

## 2017-06-01 ENCOUNTER — Ambulatory Visit (HOSPITAL_COMMUNITY): Payer: BC Managed Care – PPO | Attending: Cardiology

## 2017-06-01 ENCOUNTER — Ambulatory Visit (HOSPITAL_BASED_OUTPATIENT_CLINIC_OR_DEPARTMENT_OTHER): Payer: BC Managed Care – PPO

## 2017-06-01 DIAGNOSIS — R0602 Shortness of breath: Secondary | ICD-10-CM

## 2017-06-01 DIAGNOSIS — R079 Chest pain, unspecified: Secondary | ICD-10-CM | POA: Insufficient documentation

## 2017-06-01 DIAGNOSIS — R072 Precordial pain: Secondary | ICD-10-CM

## 2017-06-01 DIAGNOSIS — I10 Essential (primary) hypertension: Secondary | ICD-10-CM | POA: Diagnosis not present

## 2017-06-01 DIAGNOSIS — E119 Type 2 diabetes mellitus without complications: Secondary | ICD-10-CM | POA: Insufficient documentation

## 2017-06-01 DIAGNOSIS — E785 Hyperlipidemia, unspecified: Secondary | ICD-10-CM | POA: Diagnosis not present

## 2017-06-08 DIAGNOSIS — K5904 Chronic idiopathic constipation: Secondary | ICD-10-CM | POA: Diagnosis not present

## 2017-06-08 DIAGNOSIS — K219 Gastro-esophageal reflux disease without esophagitis: Secondary | ICD-10-CM | POA: Diagnosis not present

## 2017-06-08 DIAGNOSIS — R140 Abdominal distension (gaseous): Secondary | ICD-10-CM | POA: Diagnosis not present

## 2017-06-29 DIAGNOSIS — K5904 Chronic idiopathic constipation: Secondary | ICD-10-CM | POA: Diagnosis not present

## 2017-06-29 DIAGNOSIS — R140 Abdominal distension (gaseous): Secondary | ICD-10-CM | POA: Diagnosis not present

## 2017-06-29 DIAGNOSIS — K219 Gastro-esophageal reflux disease without esophagitis: Secondary | ICD-10-CM | POA: Diagnosis not present

## 2017-07-04 DIAGNOSIS — N760 Acute vaginitis: Secondary | ICD-10-CM | POA: Diagnosis not present

## 2017-08-24 DIAGNOSIS — Z01419 Encounter for gynecological examination (general) (routine) without abnormal findings: Secondary | ICD-10-CM | POA: Diagnosis not present

## 2017-08-28 ENCOUNTER — Ambulatory Visit: Payer: BC Managed Care – PPO | Admitting: Cardiovascular Disease

## 2017-08-28 DIAGNOSIS — R0989 Other specified symptoms and signs involving the circulatory and respiratory systems: Secondary | ICD-10-CM

## 2017-09-27 ENCOUNTER — Other Ambulatory Visit: Payer: Self-pay | Admitting: Physician Assistant

## 2017-09-27 ENCOUNTER — Ambulatory Visit
Admission: RE | Admit: 2017-09-27 | Discharge: 2017-09-27 | Disposition: A | Discharge status: Home / Self Care | Payer: BC Managed Care – PPO | Source: Ambulatory Visit | Attending: Physician Assistant | Admitting: Physician Assistant

## 2017-09-27 DIAGNOSIS — T1490XA Injury, unspecified, initial encounter: Secondary | ICD-10-CM

## 2017-10-13 DIAGNOSIS — S0500XA Injury of conjunctiva and corneal abrasion without foreign body, unspecified eye, initial encounter: Secondary | ICD-10-CM | POA: Diagnosis not present

## 2017-10-17 DIAGNOSIS — H18211 Corneal edema secondary to contact lens, right eye: Secondary | ICD-10-CM | POA: Diagnosis not present

## 2017-11-03 DIAGNOSIS — H1131 Conjunctival hemorrhage, right eye: Secondary | ICD-10-CM | POA: Diagnosis not present

## 2017-11-10 ENCOUNTER — Other Ambulatory Visit (HOSPITAL_COMMUNITY): Payer: Self-pay | Admitting: Family Medicine

## 2017-11-10 ENCOUNTER — Ambulatory Visit (HOSPITAL_COMMUNITY)
Admission: RE | Admit: 2017-11-10 | Discharge: 2017-11-10 | Disposition: A | Discharge status: Home / Self Care | Payer: BC Managed Care – PPO | Source: Ambulatory Visit | Attending: Family Medicine | Admitting: Family Medicine

## 2017-11-10 DIAGNOSIS — R1011 Right upper quadrant pain: Secondary | ICD-10-CM | POA: Insufficient documentation

## 2017-11-10 DIAGNOSIS — R109 Unspecified abdominal pain: Secondary | ICD-10-CM | POA: Diagnosis not present

## 2017-11-16 ENCOUNTER — Other Ambulatory Visit: Payer: Self-pay | Admitting: Gastroenterology

## 2017-11-16 DIAGNOSIS — K760 Fatty (change of) liver, not elsewhere classified: Secondary | ICD-10-CM | POA: Diagnosis not present

## 2017-11-16 DIAGNOSIS — R1011 Right upper quadrant pain: Secondary | ICD-10-CM | POA: Diagnosis not present

## 2017-11-16 DIAGNOSIS — K5904 Chronic idiopathic constipation: Secondary | ICD-10-CM | POA: Diagnosis not present

## 2017-11-22 ENCOUNTER — Ambulatory Visit (HOSPITAL_COMMUNITY)
Admission: RE | Admit: 2017-11-22 | Discharge: 2017-11-22 | Disposition: A | Discharge status: Home / Self Care | Payer: BC Managed Care – PPO | Source: Ambulatory Visit | Attending: Gastroenterology | Admitting: Gastroenterology

## 2017-11-22 DIAGNOSIS — R1011 Right upper quadrant pain: Secondary | ICD-10-CM | POA: Insufficient documentation

## 2017-11-22 DIAGNOSIS — R110 Nausea: Secondary | ICD-10-CM | POA: Diagnosis not present

## 2017-11-22 MED ORDER — TECHNETIUM TC 99M MEBROFENIN IV KIT
5.0000 | PACK | Freq: Once | INTRAVENOUS | Status: AC | PRN
  Administered 2017-11-22: 5 via INTRAVENOUS

## 2017-11-28 DIAGNOSIS — E1169 Type 2 diabetes mellitus with other specified complication: Secondary | ICD-10-CM | POA: Diagnosis not present

## 2017-11-28 DIAGNOSIS — I10 Essential (primary) hypertension: Secondary | ICD-10-CM | POA: Diagnosis not present

## 2017-11-28 DIAGNOSIS — E7800 Pure hypercholesterolemia, unspecified: Secondary | ICD-10-CM | POA: Diagnosis not present

## 2017-11-28 DIAGNOSIS — Z23 Encounter for immunization: Secondary | ICD-10-CM | POA: Diagnosis not present

## 2017-12-03 ENCOUNTER — Other Ambulatory Visit: Payer: Self-pay

## 2017-12-03 ENCOUNTER — Encounter (HOSPITAL_COMMUNITY): Payer: Self-pay

## 2017-12-03 ENCOUNTER — Emergency Department (HOSPITAL_COMMUNITY)
Admission: EM | Admit: 2017-12-03 | Discharge: 2017-12-03 | Disposition: A | Discharge status: Home / Self Care | Payer: BC Managed Care – PPO | Attending: Emergency Medicine | Admitting: Emergency Medicine

## 2017-12-03 DIAGNOSIS — Z7982 Long term (current) use of aspirin: Secondary | ICD-10-CM | POA: Diagnosis not present

## 2017-12-03 DIAGNOSIS — J45909 Unspecified asthma, uncomplicated: Secondary | ICD-10-CM | POA: Insufficient documentation

## 2017-12-03 DIAGNOSIS — X58XXXA Exposure to other specified factors, initial encounter: Secondary | ICD-10-CM | POA: Insufficient documentation

## 2017-12-03 DIAGNOSIS — S39012A Strain of muscle, fascia and tendon of lower back, initial encounter: Secondary | ICD-10-CM | POA: Diagnosis not present

## 2017-12-03 DIAGNOSIS — R109 Unspecified abdominal pain: Secondary | ICD-10-CM | POA: Insufficient documentation

## 2017-12-03 DIAGNOSIS — Y939 Activity, unspecified: Secondary | ICD-10-CM | POA: Insufficient documentation

## 2017-12-03 DIAGNOSIS — Z7984 Long term (current) use of oral hypoglycemic drugs: Secondary | ICD-10-CM | POA: Diagnosis not present

## 2017-12-03 DIAGNOSIS — Y929 Unspecified place or not applicable: Secondary | ICD-10-CM | POA: Insufficient documentation

## 2017-12-03 DIAGNOSIS — S3992XA Unspecified injury of lower back, initial encounter: Secondary | ICD-10-CM | POA: Diagnosis present

## 2017-12-03 DIAGNOSIS — Z79899 Other long term (current) drug therapy: Secondary | ICD-10-CM | POA: Insufficient documentation

## 2017-12-03 DIAGNOSIS — I10 Essential (primary) hypertension: Secondary | ICD-10-CM | POA: Insufficient documentation

## 2017-12-03 DIAGNOSIS — Y999 Unspecified external cause status: Secondary | ICD-10-CM | POA: Insufficient documentation

## 2017-12-03 DIAGNOSIS — E119 Type 2 diabetes mellitus without complications: Secondary | ICD-10-CM | POA: Insufficient documentation

## 2017-12-03 LAB — URINALYSIS, ROUTINE W REFLEX MICROSCOPIC
Bilirubin Urine: NEGATIVE
GLUCOSE, UA: NEGATIVE mg/dL
HGB URINE DIPSTICK: NEGATIVE
Ketones, ur: NEGATIVE mg/dL
Leukocytes, UA: NEGATIVE
Nitrite: NEGATIVE
PH: 6 (ref 5.0–8.0)
Protein, ur: NEGATIVE mg/dL
SPECIFIC GRAVITY, URINE: 1.009 (ref 1.005–1.030)

## 2017-12-03 MED ORDER — IBUPROFEN 800 MG PO TABS: 800.0000 mg | ORAL_TABLET | Freq: Three times a day (TID) | ORAL | 0 refills | Status: DC | PRN

## 2017-12-03 MED ORDER — LIDOCAINE 5 % EX PTCH: 1.0000 | MEDICATED_PATCH | CUTANEOUS | 0 refills | Status: AC

## 2017-12-03 MED ORDER — LIDOCAINE 5 % EX PTCH
1.0000 | MEDICATED_PATCH | CUTANEOUS | Status: DC
  Administered 2017-12-03: 1 via TRANSDERMAL
  Filled 2017-12-03: qty 1

## 2017-12-03 MED ORDER — CYCLOBENZAPRINE HCL 10 MG PO TABS: 10.0000 mg | ORAL_TABLET | Freq: Two times a day (BID) | ORAL | 0 refills | Status: DC | PRN

## 2017-12-03 MED ORDER — KETOROLAC TROMETHAMINE 60 MG/2ML IM SOLN
60.0000 mg | Freq: Once | INTRAMUSCULAR | Status: AC
  Administered 2017-12-03: 60 mg via INTRAMUSCULAR
  Filled 2017-12-03: qty 2

## 2017-12-03 NOTE — ED Provider Notes (Signed)
[facility]-EMERGENCY DEPT Provider Note   CSN: [id number] Arrival date & time: 12/03/17  4098     History   Chief Complaint Chief Complaint  Patient presents with  . Back Pain    HPI Kristen Holland is a 52 y.o. female.  The history is provided by the patient.  Back Pain   This is a new problem. The current episode started more than 2 days ago. The problem occurs daily. The problem has not changed since onset.The pain is associated with no known injury. The pain is present in the lumbar spine. The quality of the pain is described as aching. The pain radiates to the right thigh. The pain is at a severity of 4/10. The pain is moderate. The symptoms are aggravated by bending, twisting and certain positions. Associated symptoms include abdominal pain (chronic). Pertinent negatives include no chest pain, no fever, no numbness, no weight loss, no headaches, no abdominal swelling, no bowel incontinence, no perianal numbness, no bladder incontinence, no dysuria, no pelvic pain, no leg pain, no paresthesias, no paresis, no tingling and no weakness. She has tried nothing for the symptoms. The treatment provided no relief.    Past Medical History:  Diagnosis Date  . Abdominal pain   . Allergy   . Arthritis   . Asthma   . Diabetes mellitus without complication (HCC)   . GERD (gastroesophageal reflux disease)   . Hyperlipidemia   . Hypertension   . Nausea & vomiting     Patient Active Problem List   Diagnosis Date Noted  . Essential hypertension 05/13/2017  . Diabetes mellitus type 2 in obese (HCC) 05/13/2017  . Hypercholesterolemia 05/13/2017  . Obesity with serious comorbidity 05/13/2017  . Family history of CHF (congestive heart failure) 05/13/2017  . Diabetes (HCC) 12/29/2011  . RUQ pain 12/29/2011  . Constipation 12/29/2011    Past Surgical History:  Procedure Laterality Date  . ABDOMINAL HYSTERECTOMY    . APPENDECTOMY    . BACK SURGERY       OB  History   None      Home Medications    Prior to Admission medications   Medication Sig Start Date End Date Taking? Authorizing Provider  albuterol (PROVENTIL HFA;VENTOLIN HFA) 108 (90 BASE) MCG/ACT inhaler Inhale 1-2 puffs into the lungs every 6 (six) hours as needed for wheezing or shortness of breath. Patient taking differently: Inhale 2 puffs into the lungs every 6 (six) hours as needed for wheezing or shortness of breath.  11/21/13   Mellody Drown, PA-C  aspirin EC 81 MG tablet Take 81 mg by mouth daily.    [provider]  atorvastatin (LIPITOR) 40 MG tablet Take 40 mg by mouth daily. 10/31/14   [provider]  beclomethasone (QVAR) 40 MCG/ACT inhaler Inhale 1 puff into the lungs daily.     [provider]  cyclobenzaprine (FLEXERIL) 10 MG tablet Take 1 tablet (10 mg total) by mouth 2 (two) times daily as needed for up to 20 doses for muscle spasms. 12/03/17   Luise Yamamoto, DO  dexlansoprazole (DEXILANT) 60 MG capsule Take 60 mg by mouth daily.    [provider]  diclofenac (VOLTAREN) 75 MG EC tablet Take 1 tablet (75 mg total) by mouth 2 (two) times daily. 04/13/16   Elvina Sidle, MD  hydrochlorothiazide (HYDRODIURIL) 25 MG tablet Take 1 tablet (25 mg total) by mouth daily. 05/11/17 08/09/17  Croitoru, Mihai, MD  ibuprofen (ADVIL,MOTRIN) 800 MG tablet Take 1 tablet (800  mg total) by mouth every 8 (eight) hours as needed for up to 30 doses. 12/03/17   Keng Jewel, DO  lidocaine (LIDODERM) 5 % Place 1 patch onto the skin daily for 30 doses. Remove & Discard patch within 12 hours or as directed by MD 12/03/17 01/02/18  Virgina Norfolk, DO  Lifitegrast (XIIDRA) 5 % SOLN Place 1 drop into both eyes daily.    [provider]  Liraglutide (VICTOZA) 18 MG/3ML SOLN Inject 1.8 mg into the skin every morning.     [provider]  losartan (COZAAR) 100 MG tablet Take 100 mg by mouth daily.    [provider]  meloxicam (MOBIC) 15 MG  tablet Take 15 mg by mouth daily.    [provider]  metFORMIN (GLUCOPHAGE-XR) 500 MG 24 hr tablet Take 500 mg by mouth daily. 03/03/16   [provider]  montelukast (SINGULAIR) 10 MG tablet Take 10 mg by mouth every other day.     [provider]  ondansetron (ZOFRAN ODT) 8 MG disintegrating tablet Take 1 tablet (8 mg total) by mouth every 8 (eight) hours as needed for nausea or vomiting. 03/19/16   Street, Mercedes, PA-C  ranitidine (ZANTAC) 150 MG tablet Take 1 tablet (150 mg total) by mouth 2 (two) times daily. 03/19/16   Street, [place], PA-C  traMADol (ULTRAM) 50 MG tablet Take 1 tablet (50 mg total) by mouth every 6 (six) hours as needed. 04/13/16   Elvina Sidle, MD  Vitamin D, Ergocalciferol, (DRISDOL) 50000 UNITS CAPS capsule Take 50,000 Units by mouth 2 (two) times a week. Tuesday and friday    [provider]    Family History Family History  Problem Relation Age of Onset  . Hypertension Mother   . Diabetes Mother   . Cancer Mother   . Cancer Father   . Diabetes Father   . Hypertension Father   . Heart murmur Sister   . Heart failure Child     Social History Social History   Tobacco Use  . Smoking status: Never Smoker  . Smokeless tobacco: Never Used  Substance Use Topics  . Alcohol use: No  . Drug use: No     Allergies   Patient has no known allergies.   Review of Systems Review of Systems  Constitutional: Negative for chills, fever and weight loss.  HENT: Negative for ear pain and sore throat.   Eyes: Negative for pain and visual disturbance.  Respiratory: Negative for cough and shortness of breath.   Cardiovascular: Negative for chest pain and palpitations.  Gastrointestinal: Positive for abdominal pain (chronic). Negative for bowel incontinence and vomiting.  Genitourinary: Negative for bladder incontinence, dysuria, hematuria and pelvic pain.  Musculoskeletal: Positive for back pain. Negative for arthralgias.    Skin: Negative for color change and rash.  Neurological: Negative for tingling, seizures, syncope, weakness, numbness, headaches and paresthesias.  All other systems reviewed and are negative.    Physical Exam Updated Vital Signs  ED Triage Vitals  Enc Vitals Group     BP 12/03/17 0912 114/77     Pulse Rate 12/03/17 0912 75     Resp 12/03/17 0912 18     Temp 12/03/17 0912 98 F (36.7 C)     Temp Source 12/03/17 0912 Oral     SpO2 12/03/17 0912 98 %     Weight 12/03/17 0912 225 lb (102.1 kg)     Height 12/03/17 0912 5\' 5"  (1.651 m)     Head Circumference --  Peak Flow --      Pain Score 12/03/17 0918 8     Pain Loc --      Pain Edu? --      Excl. in GC? --     Physical Exam  Constitutional: She is oriented to person, place, and time. She appears well-developed and well-nourished. No distress.  HENT:  Head: Normocephalic and atraumatic.  Eyes: Pupils are equal, round, and reactive to light. Conjunctivae and EOM are normal.  Neck: Normal range of motion. Neck supple.  Cardiovascular: Normal rate, regular rhythm, normal heart sounds and intact distal pulses. Exam reveals no gallop.  No murmur heard. Pulmonary/Chest: Effort normal and breath sounds normal. No respiratory distress.  Abdominal: Soft. She exhibits no distension. There is no tenderness.  Musculoskeletal: Normal range of motion. She exhibits tenderness (TTP to right paraspinal lumbar muscles ). She exhibits no edema.  Neurological: She is alert and oriented to person, place, and time. No cranial nerve deficit or sensory deficit. She exhibits normal muscle tone. Coordination normal.  5+/5 strength, normal sensation, normal finger to nose finger  Skin: Skin is warm and dry. Capillary refill takes less than 2 seconds.  Psychiatric: She has a normal mood and affect.  Nursing note and vitals reviewed.    ED Treatments / Results  Labs (all labs ordered are listed, but only abnormal results are displayed) Labs  Reviewed  URINALYSIS, ROUTINE W REFLEX MICROSCOPIC - Abnormal; Notable for the following components:      Result Value   Color, Urine STRAW (*)    All other components within normal limits    EKG None  Radiology No results found.  Procedures Procedures (including critical care time)  Medications Ordered in ED Medications  lidocaine (LIDODERM) 5 % 1 patch (1 patch Transdermal Patch Applied 12/03/17 1020)  ketorolac (TORADOL) injection 60 mg (60 mg Intramuscular Given 12/03/17 1022)     Initial Impression / Assessment and Plan / ED Course  I have reviewed the triage vital signs and the nursing notes.  Pertinent labs & imaging results that were available during my care of the patient were reviewed by me and considered in my medical decision making (see chart for details).     Lalania Haseman is a 52 year old female with history of chronic abdominal pain, hypertension, high cholesterol who presents to the ED with low back pain.  Patient with unremarkable vitals.  No fever.  Patient with symptoms for the last several days.  Has taken some narcotic pain medicine without relief.  Continues to endorse intermittent right upper quadrant abdominal pain but had GI work-up already with a negative HIDA scan and right upper quadrant ultrasound.  Is not particularly tender in the right upper quadrant today.  No abdominal tenderness on exam.  Patient with tenderness in the paraspinal muscles in the lumbar spine on the right side suggestive of a muscle spasm.  She has normal neurological exam.  No concern for cauda equina.  No loss of bowel or bladder.  Urinalysis unremarkable.  No signs of urinary tract infection.  No hematuria.  Patient with no history of kidney stones.  Suspect pain is muscular in nature and given lidocaine patch and Toradol IM shot while in the ED.  Given prescription for Motrin and lidocaine patches and Flexeril and discharged from ED in good condition.  Recommend follow-up with primary  care doctor. Told her return to ED if symptoms worsen.  This chart was dictated using voice recognition software.  Despite best  efforts to proofread,  errors can occur which can change the documentation meaning.  Final Clinical Impressions(s) / ED Diagnoses   Final diagnoses:  Strain of lumbar region, initial encounter    ED Discharge Orders         Ordered    ibuprofen (ADVIL,MOTRIN) 800 MG tablet  Every 8 hours PRN     12/03/17 1051    lidocaine (LIDODERM) 5 %  Every 24 hours     12/03/17 1051    cyclobenzaprine (FLEXERIL) 10 MG tablet  2 times daily PRN     12/03/17 1051           Ladelle Teodoro, DO 12/03/17 1051

## 2017-12-03 NOTE — ED Triage Notes (Signed)
Patient c/o constant right flank pain x 2 days. patient denies any urinary symptoms.  Patient also c/o upper abdominal pain x 3 weeks. Patient states she has had an "[place] and gall bladder tests." which were negative.

## 2017-12-05 DIAGNOSIS — M545 Low back pain: Secondary | ICD-10-CM | POA: Diagnosis not present

## 2017-12-07 ENCOUNTER — Ambulatory Visit
Admission: RE | Admit: 2017-12-07 | Discharge: 2017-12-07 | Disposition: A | Discharge status: Home / Self Care | Payer: BC Managed Care – PPO | Source: Ambulatory Visit | Attending: Physician Assistant | Admitting: Physician Assistant

## 2017-12-07 ENCOUNTER — Other Ambulatory Visit: Payer: Self-pay | Admitting: Physician Assistant

## 2017-12-07 DIAGNOSIS — M5489 Other dorsalgia: Secondary | ICD-10-CM

## 2017-12-08 ENCOUNTER — Encounter (HOSPITAL_COMMUNITY): Payer: Self-pay | Admitting: *Deleted

## 2017-12-08 ENCOUNTER — Emergency Department (HOSPITAL_COMMUNITY)
Admission: EM | Admit: 2017-12-08 | Discharge: 2017-12-08 | Disposition: A | Discharge status: Home / Self Care | Payer: BC Managed Care – PPO | Attending: Emergency Medicine | Admitting: Emergency Medicine

## 2017-12-08 ENCOUNTER — Other Ambulatory Visit: Payer: Self-pay

## 2017-12-08 DIAGNOSIS — Z7984 Long term (current) use of oral hypoglycemic drugs: Secondary | ICD-10-CM | POA: Diagnosis not present

## 2017-12-08 DIAGNOSIS — Z79899 Other long term (current) drug therapy: Secondary | ICD-10-CM | POA: Insufficient documentation

## 2017-12-08 DIAGNOSIS — M5441 Lumbago with sciatica, right side: Secondary | ICD-10-CM | POA: Diagnosis not present

## 2017-12-08 DIAGNOSIS — I10 Essential (primary) hypertension: Secondary | ICD-10-CM | POA: Diagnosis not present

## 2017-12-08 DIAGNOSIS — E119 Type 2 diabetes mellitus without complications: Secondary | ICD-10-CM | POA: Insufficient documentation

## 2017-12-08 DIAGNOSIS — Z7982 Long term (current) use of aspirin: Secondary | ICD-10-CM | POA: Diagnosis not present

## 2017-12-08 DIAGNOSIS — M545 Low back pain: Secondary | ICD-10-CM | POA: Diagnosis present

## 2017-12-08 LAB — URINALYSIS, ROUTINE W REFLEX MICROSCOPIC
BILIRUBIN URINE: NEGATIVE
Glucose, UA: NEGATIVE mg/dL
Hgb urine dipstick: NEGATIVE
KETONES UR: NEGATIVE mg/dL
LEUKOCYTES UA: NEGATIVE
NITRITE: NEGATIVE
Protein, ur: NEGATIVE mg/dL
Specific Gravity, Urine: 1.014 (ref 1.005–1.030)
pH: 6 (ref 5.0–8.0)

## 2017-12-08 MED ORDER — HYDROCODONE-ACETAMINOPHEN 5-325 MG PO TABS
2.0000 | ORAL_TABLET | Freq: Once | ORAL | Status: AC
  Administered 2017-12-08: 2 via ORAL
  Filled 2017-12-08: qty 2

## 2017-12-08 MED ORDER — OXYCODONE-ACETAMINOPHEN 5-325 MG PO TABS: 2.0000 | ORAL_TABLET | Freq: Once | ORAL | Status: DC

## 2017-12-08 MED ORDER — METHOCARBAMOL 500 MG PO TABS
500.0000 mg | ORAL_TABLET | Freq: Once | ORAL | Status: AC
  Administered 2017-12-08: 500 mg via ORAL
  Filled 2017-12-08: qty 1

## 2017-12-08 MED ORDER — HYDROCODONE-ACETAMINOPHEN 5-325 MG PO TABS: 1.0000 | ORAL_TABLET | ORAL | 0 refills | Status: DC | PRN

## 2017-12-08 MED ORDER — METHOCARBAMOL 500 MG PO TABS: 500.0000 mg | ORAL_TABLET | Freq: Two times a day (BID) | ORAL | 0 refills | Status: DC

## 2017-12-08 NOTE — ED Triage Notes (Addendum)
Pt c/o lower back pain and feels like she has to urinate but feels like she can't go when she needs to since Saturday. Pt was seen here for the same a couple of days ago. She also so her doctor who ordered a lumbar xray. No loss of bowel or bladder. Last tramadol at 1740 today. Ambulatory to lobby with steady gait.

## 2017-12-08 NOTE — Discharge Instructions (Signed)
1. Medications: robaxin, vicodin, usual home medications °2. Treatment: rest, drink plenty of fluids, gentle stretching as discussed, alternate ice and heat °3. Follow Up: Please followup with your primary doctor in 3 days for discussion of your diagnoses and further evaluation after today's visit; if you do not have a primary care doctor use the resource guide provided to find one;  Return to the ER for worsening back pain, difficulty walking, loss of bowel or bladder control or other concerning symptoms ° ° ° °

## 2017-12-08 NOTE — ED Provider Notes (Signed)
[facility]   CSN: [id number] Arrival date & time: 12/08/17  0018     History   Chief Complaint Chief Complaint  Patient presents with  . Back Pain    HPI Kristen Holland is a 52 y.o. female with a hx of arthritis, asthma, NIDDM, GERD, HTN, hyperlipidemia presents to the Emergency Department complaining of gradual, persistent, progressively worsening low back pain onset 5 days ago.  Pt denies falls or known trauma.  She reports it happened after she picked up a small dog.  She denies new activities, heavy lifting or repetitive activities.  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 Pt reports the pain intermittently radiates down her left leg.  Patient reports a history of lumbar surgery in 2007 by Dr. Jeral Fruit and has had multiple epidural injections through 2014.  Patient reports no significant problems with her back since 2014.  Over the last week she has been to the emergency department and her primary care.  She has tried prednisone, ibuprofen, tramadol and Flexeril all without significant relief.  She denies fever, chills, gait disturbance, loss of bowel or bladder control, dysuria, hematuria, changes in blood sugar.  Patient does not take an anticoagulant and denies IV drug use.  No night sweats.  Patient reported to RN at triage that she had difficulty urinating however clarification of this revealed the patient had significant pain when sitting on the toilet to urinate.  She reports that sitting makes her symptoms worse and standing does make them better.    The history is provided by the patient and medical records. No language interpreter was used.    Past Medical History:  Diagnosis Date  . Abdominal pain   . Allergy   . Arthritis   . Asthma   . Diabetes mellitus without complication (HCC)   . GERD (gastroesophageal reflux disease)   . Hyperlipidemia   . Hypertension   . Nausea & vomiting     Patient Active Problem  List   Diagnosis Date Noted  . Essential hypertension 05/13/2017  . Diabetes mellitus type 2 in obese (HCC) 05/13/2017  . Hypercholesterolemia 05/13/2017  . Obesity with serious comorbidity 05/13/2017  . Family history of CHF (congestive heart failure) 05/13/2017  . Diabetes (HCC) 12/29/2011  . RUQ pain 12/29/2011  . Constipation 12/29/2011    Past Surgical History:  Procedure Laterality Date  . ABDOMINAL HYSTERECTOMY    . APPENDECTOMY    . BACK SURGERY       OB History   None      Home Medications    Prior to Admission medications   Medication Sig Start Date End Date Taking? Authorizing Provider  albuterol (PROVENTIL HFA;VENTOLIN HFA) 108 (90 BASE) MCG/ACT inhaler Inhale 1-2 puffs into the lungs every 6 (six) hours as needed for wheezing or shortness of breath. Patient taking differently: Inhale 2 puffs into the lungs every 6 (six) hours as needed for wheezing or shortness of breath.  11/21/13   Mellody Drown, PA-C  aspirin EC 81 MG tablet Take 81 mg by mouth daily.    [provider]  atorvastatin (LIPITOR) 40 MG tablet Take 40 mg by mouth daily. 10/31/14   [provider]  beclomethasone (QVAR) 40 MCG/ACT inhaler Inhale 1 puff into the lungs daily.     [provider]  cyclobenzaprine (FLEXERIL) 10 MG tablet Take 1 tablet (10 mg total) by mouth 2 (two) times daily as needed for up to 20 doses  for muscle spasms. 12/03/17   Curatolo, Adam, DO  dexlansoprazole (DEXILANT) 60 MG capsule Take 60 mg by mouth daily.    [provider]  diclofenac (VOLTAREN) 75 MG EC tablet Take 1 tablet (75 mg total) by mouth 2 (two) times daily. 04/13/16   Elvina Sidle, MD  hydrochlorothiazide (HYDRODIURIL) 25 MG tablet Take 1 tablet (25 mg total) by mouth daily. 05/11/17 08/09/17  Croitoru, Mihai, MD  HYDROcodone-acetaminophen (NORCO/VICODIN) 5-325 MG tablet Take 1 tablet by mouth every 4 (four) hours as needed. 12/08/17   Eloise Picone, Dahlia Client, PA-C  ibuprofen  (ADVIL,MOTRIN) 800 MG tablet Take 1 tablet (800 mg total) by mouth every 8 (eight) hours as needed for up to 30 doses. 12/03/17   Cur[place]olo, Adam, DO  lidocaine (LIDODERM) 5 % Place 1 patch onto the skin daily for 30 doses. Remove & Discard patch within 12 hours or as directed by MD 12/03/17 01/02/18  Virgina Norfolk, DO  Lifitegrast (XIIDRA) 5 % SOLN Place 1 drop into both eyes daily.    [provider]  Liraglutide (VICTOZA) 18 MG/3ML SOLN Inject 1.8 mg into the skin every morning.     [provider]  losartan (COZAAR) 100 MG tablet Take 100 mg by mouth daily.    [provider]  meloxicam (MOBIC) 15 MG tablet Take 15 mg by mouth daily.    [provider]  metFORMIN (GLUCOPHAGE-XR) 500 MG 24 hr tablet Take 500 mg by mouth daily. 03/03/16   [provider]  methocarbamol (ROBAXIN) 500 MG tablet Take 1 tablet (500 mg total) by mouth 2 (two) times daily. 12/08/17   Tasha Jindra, Dahlia Client, PA-C  montelukast (SINGULAIR) 10 MG tablet Take 10 mg by mouth every other day.     [provider]  ondansetron (ZOFRAN ODT) 8 MG disintegrating tablet Take 1 tablet (8 mg total) by mouth every 8 (eight) hours as needed for nausea or vomiting. 03/19/16   Street, Mercedes, PA-C  ranitidine (ZANTAC) 150 MG tablet Take 1 tablet (150 mg total) by mouth 2 (two) times daily. 03/19/16   Street, [place], PA-C  traMADol (ULTRAM) 50 MG tablet Take 1 tablet (50 mg total) by mouth every 6 (six) hours as needed. 04/13/16   Elvina Sidle, MD  Vitamin D, Ergocalciferol, (DRISDOL) 50000 UNITS CAPS capsule Take 50,000 Units by mouth 2 (two) times a week. Tuesday and friday    [provider]    Family History Family History  Problem Relation Age of Onset  . Hypertension Mother   . Diabetes Mother   . Cancer Mother   . Cancer Father   . Diabetes Father   . Hypertension Father   . Heart murmur Sister   . Heart failure Child     Social History Social History    Tobacco Use  . Smoking status: Never Smoker  . Smokeless tobacco: Never Used  Substance Use Topics  . Alcohol use: No  . Drug use: No     Allergies   Patient has no known allergies.   Review of Systems Review of Systems  Constitutional: Negative for fatigue and fever.  Respiratory: Negative for chest tightness and shortness of breath.   Cardiovascular: Negative for chest pain.  Gastrointestinal: Negative for abdominal pain, diarrhea, nausea and vomiting.  Endocrine: Negative for polydipsia, polyphagia and polyuria.  Genitourinary: Negative for dysuria, frequency, hematuria and urgency.  Musculoskeletal: Positive for back pain. Negative for gait problem, joint swelling, neck pain and neck stiffness.  Skin: Negative for rash.  Neurological:  Negative for weakness, light-headedness, numbness and headaches.  All other systems reviewed and are negative.    Physical Exam Updated Vital Signs BP 127/81 (BP Location: Right Arm)   Pulse 77   Temp 97.7 F (36.5 C) (Oral)   Resp 17   Ht 5\' 5"  (1.651 m)   Wt 102.1 kg   SpO2 100%   BMI 37.44 kg/m   Physical Exam  Constitutional: She appears well-developed and well-nourished. No distress.  Pt appears uncomfortable  HENT:  Head: Normocephalic and atraumatic.  Mouth/Throat: Oropharynx is clear and moist. No oropharyngeal exudate.  Eyes: Conjunctivae are normal.  Neck: Normal range of motion. Neck supple.  Full ROM without pain  Cardiovascular: Normal rate, regular rhythm and intact distal pulses.  Pulmonary/Chest: Effort normal and breath sounds normal. No respiratory distress. She has no wheezes.  Abdominal: Soft. She exhibits no distension. There is no tenderness.  Musculoskeletal:  Decreased range of motion of the T-spine and L-spine due to pain Mild midline tenderness to the  T-spine or L-spine Tenderness to palpation of the paraspinous muscles of the L-spine  Lymphadenopathy:    She has no cervical adenopathy.   Neurological: She is alert.  Speech is clear and goal oriented, follows commands Normal 5/5 strength in upper and lower extremities bilaterally including dorsiflexion and plantar flexion, strong and equal grip strength Sensation normal to light and sharp touch Moves extremities without ataxia, coordination intact Antalgic gait Normal balance No Clonus  Skin: Skin is warm and dry. No rash noted. She is not diaphoretic. No erythema.  Psychiatric: She has a normal mood and affect. Her behavior is normal.  Nursing note and vitals reviewed.    ED Treatments / Results  Labs (all labs ordered are listed, but only abnormal results are displayed) Labs Reviewed  URINALYSIS, ROUTINE W REFLEX MICROSCOPIC     Radiology Dg Lumbar Spine Complete  Result Date: 12/07/2017 CLINICAL DATA:  Severe low back pain for 6 days. EXAM: LUMBAR SPINE - COMPLETE 4+ VIEW COMPARISON:  Plain films lumbar spine 01/25/2016 and plain films the abdomen 03/14/2016. FINDINGS: Convex left curvature is likely positional as it is not seen on the prior examinations. Vertebral body height is maintained. Trace anterolisthesis L5 on S1 is unchanged loss of disc space height is noted at L5-S1 appears unchanged. IMPRESSION: No acute abnormality. 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 Electronically Signed   By: Drusilla Kanner M.D.   On: 12/07/2017 12:27    Procedures Procedures (including critical care time)  Medications Ordered in ED Medications  methocarbamol (ROBAXIN) tablet 500 mg (500 mg Oral Given 12/08/17 1610)  HYDROcodone-acetaminophen (NORCO/VICODIN) 5-325 MG per tablet 2 tablet (2 tablets Oral Given 12/08/17 9604)     Initial Impression / Assessment and Plan / ED Course  I have reviewed the triage vital signs and the nursing notes.  Pertinent labs & imaging results that were available during my care of the patient were reviewed by me and considered in my medical decision making (see chart for details).      Patient with back pain.  No neurological deficits and normal neuro exam.  Patient can walk but states is painful.  No loss of bowel or bladder control.  No concern for cauda equina.  No fever, night sweats, weight loss, h/o cancer, IVDU.  UA without evidence of urinary tract infection.  Record review shows lumbar x-rays yesterday ordered by her primary care.  I personally evaluated these images.  They show no change in the degenerative  disc disease of L5-S1 however I do think this is the location of patient's pain.  Discussed increasing pain control with patient.  Will give short prescription of Vicodin and will change muscle relaxer to Robaxin.  Discussed with patient, son and husband that she is to stop her tramadol and Flexeril to start Vicodin and Robaxin.  They state understanding.  Specifically patient states understanding that she should not mix Flexeril and Robaxin or Vicodin and tramadol.  Klang protocol and pain medicine indicated and discussed with patient.  Discussed reasons to return immediately to the emergency department.  Referral given for Dr. Jeral Fruit for further evaluation and treatment.   Final Clinical Impressions(s) / ED Diagnoses   Final diagnoses:  Acute bilateral low back pain with right-sided sciatica    ED Discharge Orders         Ordered    HYDROcodone-acetaminophen (NORCO/VICODIN) 5-325 MG tablet  Every 4 hours PRN     12/08/17 0718    methocarbamol (ROBAXIN) 500 MG tablet  2 times daily     12/08/17 0718           Aimar Borghi, Dahlia Client, PA-C 12/08/17 0730    Geoffery Lyons, MD 12/09/17 [phone]

## 2017-12-20 DIAGNOSIS — M79604 Pain in right leg: Secondary | ICD-10-CM | POA: Diagnosis not present

## 2017-12-20 DIAGNOSIS — M545 Low back pain: Secondary | ICD-10-CM | POA: Diagnosis not present

## 2017-12-20 DIAGNOSIS — R262 Difficulty in walking, not elsewhere classified: Secondary | ICD-10-CM | POA: Diagnosis not present

## 2018-01-02 DIAGNOSIS — M545 Low back pain: Secondary | ICD-10-CM | POA: Diagnosis not present

## 2018-01-02 DIAGNOSIS — R262 Difficulty in walking, not elsewhere classified: Secondary | ICD-10-CM | POA: Diagnosis not present

## 2018-01-02 DIAGNOSIS — M79604 Pain in right leg: Secondary | ICD-10-CM | POA: Diagnosis not present

## 2018-01-29 DIAGNOSIS — G4733 Obstructive sleep apnea (adult) (pediatric): Secondary | ICD-10-CM | POA: Diagnosis not present

## 2018-01-31 DIAGNOSIS — M5416 Radiculopathy, lumbar region: Secondary | ICD-10-CM | POA: Diagnosis not present

## 2018-02-08 ENCOUNTER — Other Ambulatory Visit: Payer: Self-pay | Admitting: Anesthesiology

## 2018-02-08 DIAGNOSIS — M5416 Radiculopathy, lumbar region: Secondary | ICD-10-CM

## 2018-02-16 ENCOUNTER — Other Ambulatory Visit: Payer: Self-pay | Admitting: Cardiovascular Disease

## 2018-02-16 NOTE — Telephone Encounter (Signed)
Rx request sent to pharmacy.  

## 2018-02-22 ENCOUNTER — Ambulatory Visit
Admission: RE | Admit: 2018-02-22 | Discharge: 2018-02-22 | Disposition: A | Discharge status: Home / Self Care | Payer: BC Managed Care – PPO | Source: Ambulatory Visit | Attending: Anesthesiology | Admitting: Anesthesiology

## 2018-02-22 DIAGNOSIS — M5416 Radiculopathy, lumbar region: Secondary | ICD-10-CM

## 2018-02-22 DIAGNOSIS — M48061 Spinal stenosis, lumbar region without neurogenic claudication: Secondary | ICD-10-CM | POA: Diagnosis not present

## 2018-04-11 DIAGNOSIS — Z6838 Body mass index (BMI) 38.0-38.9, adult: Secondary | ICD-10-CM | POA: Diagnosis not present

## 2018-04-11 DIAGNOSIS — M5416 Radiculopathy, lumbar region: Secondary | ICD-10-CM | POA: Diagnosis not present

## 2018-04-17 DIAGNOSIS — M5416 Radiculopathy, lumbar region: Secondary | ICD-10-CM | POA: Diagnosis not present

## 2018-04-29 DIAGNOSIS — R52 Pain, unspecified: Secondary | ICD-10-CM | POA: Diagnosis not present

## 2018-04-29 DIAGNOSIS — R05 Cough: Secondary | ICD-10-CM | POA: Diagnosis not present

## 2018-04-29 DIAGNOSIS — J22 Unspecified acute lower respiratory infection: Secondary | ICD-10-CM | POA: Diagnosis not present

## 2018-05-02 DIAGNOSIS — R05 Cough: Secondary | ICD-10-CM | POA: Diagnosis not present

## 2018-05-02 DIAGNOSIS — R0602 Shortness of breath: Secondary | ICD-10-CM | POA: Diagnosis not present

## 2018-05-16 DIAGNOSIS — I10 Essential (primary) hypertension: Secondary | ICD-10-CM | POA: Diagnosis not present

## 2018-05-16 DIAGNOSIS — M5416 Radiculopathy, lumbar region: Secondary | ICD-10-CM | POA: Diagnosis not present

## 2018-05-16 DIAGNOSIS — Z6838 Body mass index (BMI) 38.0-38.9, adult: Secondary | ICD-10-CM | POA: Diagnosis not present

## 2018-05-25 DIAGNOSIS — E1169 Type 2 diabetes mellitus with other specified complication: Secondary | ICD-10-CM | POA: Diagnosis not present

## 2018-05-25 DIAGNOSIS — I10 Essential (primary) hypertension: Secondary | ICD-10-CM | POA: Diagnosis not present

## 2018-05-25 DIAGNOSIS — Z0000 Encounter for general adult medical examination without abnormal findings: Secondary | ICD-10-CM | POA: Diagnosis not present

## 2018-05-25 DIAGNOSIS — E7800 Pure hypercholesterolemia, unspecified: Secondary | ICD-10-CM | POA: Diagnosis not present

## 2018-06-06 DIAGNOSIS — G4733 Obstructive sleep apnea (adult) (pediatric): Secondary | ICD-10-CM | POA: Diagnosis not present

## 2018-07-17 DIAGNOSIS — R0602 Shortness of breath: Secondary | ICD-10-CM | POA: Diagnosis not present

## 2018-07-17 DIAGNOSIS — J309 Allergic rhinitis, unspecified: Secondary | ICD-10-CM | POA: Diagnosis not present

## 2018-07-17 DIAGNOSIS — J45998 Other asthma: Secondary | ICD-10-CM | POA: Diagnosis not present

## 2018-08-10 ENCOUNTER — Telehealth: Payer: Self-pay | Admitting: Nurse Practitioner

## 2018-08-10 ENCOUNTER — Other Ambulatory Visit: Payer: Self-pay | Admitting: Anesthesiology

## 2018-08-10 DIAGNOSIS — M5416 Radiculopathy, lumbar region: Secondary | ICD-10-CM

## 2018-08-10 NOTE — Telephone Encounter (Signed)
Phone call to patient to verify medication list and allergies for myelogram procedure. Pt instructed to hold tramadol and prozac for 48hrs prior to myelogram appointment time. Pt verbalized understanding. Pre and post procedure instructions reviewed with pt.

## 2018-09-10 ENCOUNTER — Other Ambulatory Visit: Payer: Self-pay

## 2018-09-10 ENCOUNTER — Ambulatory Visit
Admission: RE | Admit: 2018-09-10 | Discharge: 2018-09-10 | Disposition: A | Discharge status: Home / Self Care | Payer: BC Managed Care – PPO | Source: Ambulatory Visit | Attending: Anesthesiology | Admitting: Anesthesiology

## 2018-09-10 DIAGNOSIS — M5416 Radiculopathy, lumbar region: Secondary | ICD-10-CM

## 2018-09-10 MED ORDER — DIAZEPAM 5 MG PO TABS
10.0000 mg | ORAL_TABLET | Freq: Once | ORAL | Status: AC
  Administered 2018-09-10: 5 mg via ORAL

## 2018-09-10 MED ORDER — IOPAMIDOL (ISOVUE-M 200) INJECTION 41%
15.0000 mL | Freq: Once | INTRAMUSCULAR | Status: AC
  Administered 2018-09-10: 15 mL via INTRATHECAL

## 2018-09-10 NOTE — Progress Notes (Signed)
Patient states she has been off Tramadol and Prozac for at least the past two days.

## 2018-09-10 NOTE — Discharge Instructions (Signed)
Myelogram Discharge Instructions  1. Go home and rest quietly for the next 24 hours.  It is important to lie flat for the next 24 hours.  Get up only to go to the restroom.  You may lie in the bed or on a couch on your back, your stomach, your left side or your right side.  You may have one pillow under your head.  You may have pillows between your knees while you are on your side or under your knees while you are on your back.  2. DO NOT drive today.  Recline the seat as far back as it will go, while still wearing your seat belt, on the way home.  3. You may get up to go to the bathroom as needed.  You may sit up for 10 minutes to eat.  You may resume your normal diet and medications unless otherwise indicated.  Drink lots of extra fluids today and tomorrow.  4. The incidence of headache, nausea, or vomiting is about 5% (one in 20 patients).  If you develop a headache, lie flat and drink plenty of fluids until the headache goes away.  Caffeinated beverages may be helpful.  If you develop severe nausea and vomiting or a headache that does not go away with flat bed rest, call [phone].  5. You may resume normal activities after your 24 hours of bed rest is over; however, do not exert yourself strongly or do any heavy lifting tomorrow. If when you get up you have a headache when standing, go back to bed and force fluids for another 24 hours.  6. Call your physician for a follow-up appointment.  The results of your myelogram will be sent directly to your physician by the following day.  7. If you have any questions or if complications develop after you arrive home, please call [phone].  Discharge instructions have been explained to the patient.  The patient, or the person responsible for the patient, fully understands these instructions.  YOU MAY RESTART YOUR PROZAC AND TRAMADOL TOMORROW 09/11/2018 AT 12:40PM.

## 2018-10-10 ENCOUNTER — Other Ambulatory Visit: Payer: Self-pay

## 2018-10-10 DIAGNOSIS — Z20822 Contact with and (suspected) exposure to covid-19: Secondary | ICD-10-CM

## 2018-10-11 LAB — NOVEL CORONAVIRUS, NAA: SARS-CoV-2, NAA: NOT DETECTED

## 2019-01-07 ENCOUNTER — Ambulatory Visit
Admission: RE | Admit: 2019-01-07 | Discharge: 2019-01-07 | Disposition: A | Discharge status: Home / Self Care | Payer: BC Managed Care – PPO | Source: Ambulatory Visit | Attending: Physician Assistant | Admitting: Physician Assistant

## 2019-01-07 ENCOUNTER — Other Ambulatory Visit: Payer: Self-pay | Admitting: Physician Assistant

## 2019-01-07 DIAGNOSIS — K5900 Constipation, unspecified: Secondary | ICD-10-CM

## 2019-05-04 ENCOUNTER — Ambulatory Visit: Payer: BC Managed Care – PPO | Attending: Internal Medicine

## 2019-05-04 DIAGNOSIS — Z23 Encounter for immunization: Secondary | ICD-10-CM | POA: Insufficient documentation

## 2019-05-04 NOTE — Progress Notes (Signed)
   Covid-19 Vaccination Clinic  Name:  Kristen Holland    MRN: [id number] DOB: 1965-12-14  05/04/2019  [name] was observed post Covid-19 immunization for 15 minutes without incidence. She was provided with Vaccine Information Sheet and instruction to access the V-Safe system.   [name] was instructed to call 911 with any severe reactions post vaccine: Marland Kitchen Difficulty breathing  . Swelling of your face and throat  . A fast heartbeat  . A bad rash all over your body  . Dizziness and weakness    Immunizations Administered    Name Date Dose VIS Date Route   Pfizer COVID-19 Vaccine 05/04/2019  1:49 PM 0.3 mL 02/15/2019 Intramuscular   Manufacturer: [employer], [employer]   Lot: [id number]   NDC: [address]-0

## 2019-05-25 ENCOUNTER — Ambulatory Visit: Payer: BC Managed Care – PPO | Attending: Internal Medicine

## 2019-05-25 DIAGNOSIS — Z23 Encounter for immunization: Secondary | ICD-10-CM

## 2019-05-25 NOTE — Progress Notes (Signed)
   Covid-19 Vaccination Clinic  Name:  Kristen Holland    MRN: [id number] DOB: 1965/05/28  05/25/2019  Ms. Start was observed post Covid-19 immunization for 15 minutes without incident. She was provided with Vaccine Information Sheet and instruction to access the V-Safe system.   [name] was instructed to call 911 with any severe reactions post vaccine: Marland Kitchen Difficulty breathing  . Swelling of face and throat  . A fast heartbeat  . A bad rash all over body  . Dizziness and weakness   Immunizations Administered    Name Date Dose VIS Date Route   Pfizer COVID-19 Vaccine 05/25/2019  9:56 AM 0.3 mL 02/15/2019 Intramuscular   Manufacturer: [employer], [employer]   Lot: [id number]   NDC: [address]-3

## 2020-02-25 ENCOUNTER — Emergency Department: Payer: BC Managed Care – PPO

## 2020-02-25 ENCOUNTER — Other Ambulatory Visit: Payer: Self-pay

## 2020-02-25 ENCOUNTER — Encounter: Payer: Self-pay | Admitting: Emergency Medicine

## 2020-02-25 ENCOUNTER — Emergency Department
Admission: EM | Admit: 2020-02-25 | Discharge: 2020-02-25 | Disposition: A | Discharge status: Home / Self Care | Payer: BC Managed Care – PPO | Attending: Emergency Medicine | Admitting: Emergency Medicine

## 2020-02-25 DIAGNOSIS — E119 Type 2 diabetes mellitus without complications: Secondary | ICD-10-CM | POA: Diagnosis not present

## 2020-02-25 DIAGNOSIS — J45909 Unspecified asthma, uncomplicated: Secondary | ICD-10-CM | POA: Diagnosis not present

## 2020-02-25 DIAGNOSIS — Z7984 Long term (current) use of oral hypoglycemic drugs: Secondary | ICD-10-CM | POA: Diagnosis not present

## 2020-02-25 DIAGNOSIS — S80811A Abrasion, right lower leg, initial encounter: Secondary | ICD-10-CM | POA: Diagnosis not present

## 2020-02-25 DIAGNOSIS — M25571 Pain in right ankle and joints of right foot: Secondary | ICD-10-CM | POA: Diagnosis not present

## 2020-02-25 DIAGNOSIS — S8991XA Unspecified injury of right lower leg, initial encounter: Secondary | ICD-10-CM | POA: Diagnosis present

## 2020-02-25 DIAGNOSIS — I10 Essential (primary) hypertension: Secondary | ICD-10-CM | POA: Insufficient documentation

## 2020-02-25 DIAGNOSIS — Z7982 Long term (current) use of aspirin: Secondary | ICD-10-CM | POA: Diagnosis not present

## 2020-02-25 DIAGNOSIS — M25512 Pain in left shoulder: Secondary | ICD-10-CM | POA: Insufficient documentation

## 2020-02-25 DIAGNOSIS — Y92410 Unspecified street and highway as the place of occurrence of the external cause: Secondary | ICD-10-CM | POA: Insufficient documentation

## 2020-02-25 DIAGNOSIS — Z79899 Other long term (current) drug therapy: Secondary | ICD-10-CM | POA: Insufficient documentation

## 2020-02-25 DIAGNOSIS — M7918 Myalgia, other site: Secondary | ICD-10-CM

## 2020-02-25 MED ORDER — BACITRACIN-NEOMYCIN-POLYMYXIN 400-5-5000 EX OINT
TOPICAL_OINTMENT | Freq: Once | CUTANEOUS | Status: AC
  Administered 2020-02-25: 1 via TOPICAL
  Filled 2020-02-25: qty 1

## 2020-02-25 MED ORDER — LIDOCAINE 5 % EX PTCH
1.0000 | MEDICATED_PATCH | CUTANEOUS | Status: DC
  Administered 2020-02-25: 11:00:00 1 via TRANSDERMAL
  Filled 2020-02-25: qty 1

## 2020-02-25 MED ORDER — CYCLOBENZAPRINE HCL 10 MG PO TABS: 10.0000 mg | ORAL_TABLET | Freq: Three times a day (TID) | ORAL | 0 refills | Status: DC | PRN

## 2020-02-25 MED ORDER — IBUPROFEN 600 MG PO TABS
600.0000 mg | ORAL_TABLET | Freq: Once | ORAL | Status: DC
  Filled 2020-02-25: qty 1

## 2020-02-25 MED ORDER — IBUPROFEN 600 MG PO TABS: 600.0000 mg | ORAL_TABLET | Freq: Three times a day (TID) | ORAL | 0 refills | Status: DC | PRN

## 2020-02-25 NOTE — Discharge Instructions (Addendum)
No acute findings x-rays of the left shoulder, right lower leg, and right ankle.  Read and follow discharge care instructions.  Take medication as directed.
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
## 2020-03-03 ENCOUNTER — Other Ambulatory Visit (HOSPITAL_COMMUNITY): Payer: Self-pay | Admitting: Physician Assistant

## 2020-03-03 DIAGNOSIS — M79604 Pain in right leg: Secondary | ICD-10-CM

## 2020-03-04 ENCOUNTER — Ambulatory Visit (HOSPITAL_COMMUNITY)
Admission: RE | Admit: 2020-03-04 | Discharge: 2020-03-04 | Disposition: A | Discharge status: Home / Self Care | Payer: BC Managed Care – PPO | Source: Ambulatory Visit | Attending: Physician Assistant | Admitting: Physician Assistant

## 2020-03-04 ENCOUNTER — Other Ambulatory Visit: Payer: Self-pay

## 2020-03-04 DIAGNOSIS — M79604 Pain in right leg: Secondary | ICD-10-CM | POA: Diagnosis not present

## 2020-03-04 DIAGNOSIS — M7989 Other specified soft tissue disorders: Secondary | ICD-10-CM | POA: Insufficient documentation

## 2020-03-04 NOTE — Progress Notes (Signed)
Right lower extremity venous duplex completed. Refer to "CV Proc" under chart review to view preliminary results.  03/04/2020 1:45 PM Eula Fried., MHA, RVT, RDCS, RDMS

## 2020-04-05 ENCOUNTER — Other Ambulatory Visit: Payer: Self-pay

## 2020-04-05 ENCOUNTER — Emergency Department: Payer: BC Managed Care – PPO

## 2020-04-05 ENCOUNTER — Emergency Department
Admission: EM | Admit: 2020-04-05 | Discharge: 2020-04-05 | Disposition: A | Discharge status: Home / Self Care | Payer: BC Managed Care – PPO | Attending: Emergency Medicine | Admitting: Emergency Medicine

## 2020-04-05 ENCOUNTER — Encounter: Payer: Self-pay | Admitting: Emergency Medicine

## 2020-04-05 DIAGNOSIS — Z7951 Long term (current) use of inhaled steroids: Secondary | ICD-10-CM | POA: Insufficient documentation

## 2020-04-05 DIAGNOSIS — K5900 Constipation, unspecified: Secondary | ICD-10-CM

## 2020-04-05 DIAGNOSIS — E119 Type 2 diabetes mellitus without complications: Secondary | ICD-10-CM | POA: Insufficient documentation

## 2020-04-05 DIAGNOSIS — I10 Essential (primary) hypertension: Secondary | ICD-10-CM | POA: Insufficient documentation

## 2020-04-05 DIAGNOSIS — K219 Gastro-esophageal reflux disease without esophagitis: Secondary | ICD-10-CM | POA: Insufficient documentation

## 2020-04-05 DIAGNOSIS — Z79899 Other long term (current) drug therapy: Secondary | ICD-10-CM | POA: Diagnosis not present

## 2020-04-05 DIAGNOSIS — J45909 Unspecified asthma, uncomplicated: Secondary | ICD-10-CM | POA: Insufficient documentation

## 2020-04-05 DIAGNOSIS — R109 Unspecified abdominal pain: Secondary | ICD-10-CM | POA: Insufficient documentation

## 2020-04-05 DIAGNOSIS — Z7982 Long term (current) use of aspirin: Secondary | ICD-10-CM | POA: Diagnosis not present

## 2020-04-05 LAB — BASIC METABOLIC PANEL
Anion gap: 11 (ref 5–15)
BUN: 14 mg/dL (ref 6–20)
CO2: 28 mmol/L (ref 22–32)
Calcium: 9.5 mg/dL (ref 8.9–10.3)
Chloride: 98 mmol/L (ref 98–111)
Creatinine, Ser: 0.83 mg/dL (ref 0.44–1.00)
GFR, Estimated: >60 mL/min (ref >60)
Glucose, Bld: 123 mg/dL — ABNORMAL HIGH (ref 70–99)
Potassium: 4.2 mmol/L (ref 3.5–5.1)
Sodium: 137 mmol/L (ref 135–145)

## 2020-04-05 LAB — URINALYSIS, COMPLETE (UACMP) WITH MICROSCOPIC
Bilirubin Urine: NEGATIVE
Glucose, UA: NEGATIVE mg/dL
Hgb urine dipstick: NEGATIVE
Ketones, ur: NEGATIVE mg/dL
Leukocytes,Ua: NEGATIVE
Nitrite: NEGATIVE
Protein, ur: NEGATIVE mg/dL
Specific Gravity, Urine: 1.008 (ref 1.005–1.030)
pH: 7 (ref 5.0–8.0)

## 2020-04-05 LAB — CBC
HCT: 38.8 % (ref 36.0–46.0)
Hemoglobin: 12.9 g/dL (ref 12.0–15.0)
MCH: 28 pg (ref 26.0–34.0)
MCHC: 33.2 g/dL (ref 30.0–36.0)
MCV: 84.2 fL (ref 80.0–100.0)
Platelets: 405 10*3/uL — ABNORMAL HIGH (ref 150–400)
RBC: 4.61 MIL/uL (ref 3.87–5.11)
RDW: 13.1 % (ref 11.5–15.5)
WBC: 8.5 10*3/uL (ref 4.0–10.5)
nRBC: 0 % (ref 0.0–0.2)

## 2020-04-05 MED ORDER — DOCUSATE SODIUM 100 MG PO CAPS: 100.0000 mg | ORAL_CAPSULE | Freq: Two times a day (BID) | ORAL | 0 refills | Status: AC

## 2020-04-05 NOTE — ED Triage Notes (Signed)
Pt reports for the past 3 weeks has had intermittent sharp pain to her right flank that radiates from back to front. Pt reports relief with ibuprofen.
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
## 2020-05-10 ENCOUNTER — Emergency Department (HOSPITAL_COMMUNITY): Payer: BC Managed Care – PPO

## 2020-05-10 ENCOUNTER — Emergency Department (HOSPITAL_COMMUNITY)
Admission: EM | Admit: 2020-05-10 | Discharge: 2020-05-10 | Disposition: A | Discharge status: Home / Self Care | Payer: BC Managed Care – PPO | Attending: Emergency Medicine | Admitting: Emergency Medicine

## 2020-05-10 ENCOUNTER — Other Ambulatory Visit: Payer: Self-pay

## 2020-05-10 DIAGNOSIS — Z7951 Long term (current) use of inhaled steroids: Secondary | ICD-10-CM | POA: Diagnosis not present

## 2020-05-10 DIAGNOSIS — Z20822 Contact with and (suspected) exposure to covid-19: Secondary | ICD-10-CM | POA: Insufficient documentation

## 2020-05-10 DIAGNOSIS — I10 Essential (primary) hypertension: Secondary | ICD-10-CM | POA: Insufficient documentation

## 2020-05-10 DIAGNOSIS — Z79899 Other long term (current) drug therapy: Secondary | ICD-10-CM | POA: Diagnosis not present

## 2020-05-10 DIAGNOSIS — E119 Type 2 diabetes mellitus without complications: Secondary | ICD-10-CM | POA: Insufficient documentation

## 2020-05-10 DIAGNOSIS — Z794 Long term (current) use of insulin: Secondary | ICD-10-CM | POA: Insufficient documentation

## 2020-05-10 DIAGNOSIS — Z7984 Long term (current) use of oral hypoglycemic drugs: Secondary | ICD-10-CM | POA: Insufficient documentation

## 2020-05-10 DIAGNOSIS — J45909 Unspecified asthma, uncomplicated: Secondary | ICD-10-CM | POA: Insufficient documentation

## 2020-05-10 DIAGNOSIS — Z7982 Long term (current) use of aspirin: Secondary | ICD-10-CM | POA: Insufficient documentation

## 2020-05-10 DIAGNOSIS — J40 Bronchitis, not specified as acute or chronic: Secondary | ICD-10-CM | POA: Insufficient documentation

## 2020-05-10 DIAGNOSIS — R0602 Shortness of breath: Secondary | ICD-10-CM | POA: Diagnosis present

## 2020-05-10 LAB — CBC
HCT: 39.2 % (ref 36.0–46.0)
Hemoglobin: 12.5 g/dL (ref 12.0–15.0)
MCH: 28.2 pg (ref 26.0–34.0)
MCHC: 31.9 g/dL (ref 30.0–36.0)
MCV: 88.5 fL (ref 80.0–100.0)
Platelets: 372 10*3/uL (ref 150–400)
RBC: 4.43 MIL/uL (ref 3.87–5.11)
RDW: 13.4 % (ref 11.5–15.5)
WBC: 8.7 10*3/uL (ref 4.0–10.5)
nRBC: 0 % (ref 0.0–0.2)

## 2020-05-10 LAB — I-STAT BETA HCG BLOOD, ED (MC, WL, AP ONLY): I-stat hCG, quantitative: 6.9 m[IU]/mL — ABNORMAL HIGH (ref <5)

## 2020-05-10 LAB — RESP PANEL BY RT-PCR (FLU A&B, COVID) ARPGX2
Influenza A by PCR: NEGATIVE
Influenza B by PCR: NEGATIVE
SARS Coronavirus 2 by RT PCR: NEGATIVE

## 2020-05-10 LAB — BASIC METABOLIC PANEL
Anion gap: 11 (ref 5–15)
BUN: 11 mg/dL (ref 6–20)
CO2: 27 mmol/L (ref 22–32)
Calcium: 9.5 mg/dL (ref 8.9–10.3)
Chloride: 100 mmol/L (ref 98–111)
Creatinine, Ser: 0.87 mg/dL (ref 0.44–1.00)
GFR, Estimated: >60 mL/min (ref >60)
Glucose, Bld: 113 mg/dL — ABNORMAL HIGH (ref 70–99)
Potassium: 3.6 mmol/L (ref 3.5–5.1)
Sodium: 138 mmol/L (ref 135–145)

## 2020-05-10 LAB — TROPONIN I (HIGH SENSITIVITY): Troponin I (High Sensitivity): <2 ng/L (ref <18)

## 2020-05-10 LAB — D-DIMER, QUANTITATIVE: D-Dimer, Quant: 0.48 ug/mL-FEU (ref 0.00–0.50)

## 2020-05-10 MED ORDER — HYDROCOD POLST-CPM POLST ER 10-8 MG/5ML PO SUER: 5.0000 mL | Freq: Two times a day (BID) | ORAL | 0 refills | Status: DC | PRN

## 2020-05-10 MED ORDER — ALBUTEROL SULFATE HFA 108 (90 BASE) MCG/ACT IN AERS
2.0000 | INHALATION_SPRAY | Freq: Once | RESPIRATORY_TRACT | Status: AC
  Administered 2020-05-10: 2 via RESPIRATORY_TRACT
  Filled 2020-05-10: qty 6.7

## 2020-05-10 MED ORDER — IPRATROPIUM-ALBUTEROL 0.5-2.5 (3) MG/3ML IN SOLN
3.0000 mL | Freq: Once | RESPIRATORY_TRACT | Status: AC
  Administered 2020-05-10: 3 mL via RESPIRATORY_TRACT
  Filled 2020-05-10: qty 3

## 2020-05-10 MED ORDER — PREDNISONE 10 MG PO TABS: 40.0000 mg | ORAL_TABLET | Freq: Every day | ORAL | 0 refills | Status: AC

## 2020-05-10 NOTE — ED Notes (Signed)
Patient ambulatory in room. Oxygen saturation remained 95-100% while ambulating. Patient reports SOB during ambulation.

## 2020-05-10 NOTE — Discharge Instructions (Addendum)
At this time there does not appear to be the presence of an emergent medical condition, however there is always the potential for conditions to change. Please read and follow the below instructions.  Please return to the Emergency Department immediately for any new or worsening symptoms. Please be sure to follow up with your Primary Care Provider within one week regarding your visit today; please call their office to schedule an appointment even if you are feeling better for a follow-up visit. You may use the medication Tussionex as prescribed to help with your cough.  This medication will make you drowsy so you are not allowed to drive or perform any other potentially dangerous activities while taking this medication. Please continue to use your albuterol inhaler at home to help with your symptoms.  Please call your primary care doctor today to schedule a follow-up appointment. You may use the steroid medication prednisone as prescribed to help with your symptoms.  Please be sure to drink water to avoid dehydration while taking this medication.  Avoid excess sugar intake and carbohydrate intake as prednisone may make your blood sugar level rise.  Please monitor this closely and follow-up with your PCP.  Go to the nearest Emergency Department immediately if: You have fever or chills You cough up blood. You have chest pain. You have very bad shortness of breath. You become dehydrated. You faint or keep feeling like you are going to faint. You keep vomiting. You have a very bad headache. You have any new/concerning or worsening of symptoms   Please read the additional information packets attached to your discharge summary.  Do not take your medicine if  develop an itchy rash, swelling in your mouth or lips, or difficulty breathing; call 911 and seek immediate emergency medical attention if this occurs.  You may review your lab tests and imaging results in their entirety on your MyChart account.   Please discuss all results of fully with your primary care provider and other specialist at your follow-up visit.  Note: Portions of this text may have been transcribed using voice recognition software. Every effort was made to ensure accuracy; however, inadvertent computerized transcription errors may still be present.

## 2020-05-10 NOTE — ED Provider Notes (Signed)
[facility]-EMERGENCY DEPT Provider Note   CSN: [id number] Arrival date & time: 05/10/20  1555     History Chief Complaint  Patient presents with  . Shortness of Breath  . Chest Pain  . Asthma    Kristen Holland is a 55 y.o. female history diabetes, GERD, hypertension, hyperlipidemia, asthma.  Patient presents for chest pain shortness of breath onset 2 days ago. She reports that she was sitting at home when symptoms began no clear inciting factor.  Patient reports that she has been experiencing URI/cold symptoms just prior to onset of difficulty breathing.  She reports shortness of breath is a difficulty catching her breath this is worsened with exertion. She describes chest pain as a moderate intensity dull pressure in the center of her chest which is been constant worsened with deep breathing and coughing, no alleviating factors. She reports she has been using her albuterol without improvement. Associated symptoms rhinorrhea, nonproductive cough and mild sore throat.  Denies fever/chills, headache, neck stiffness, neck swelling, difficulty swallowing, hemoptysis, abdominal pain, nausea/vomiting, diaphoresis, extremity swelling/color change or any additional concerns. HPI     Past Medical History:  Diagnosis Date  . Abdominal pain   . Allergy   . Arthritis   . Asthma   . Diabetes mellitus without complication (HCC)   . GERD (gastroesophageal reflux disease)   . Hyperlipidemia   . Hypertension   . Nausea & vomiting     Patient Active Problem List   Diagnosis Date Noted  . Essential hypertension 05/13/2017  . Diabetes mellitus type 2 in obese (HCC) 05/13/2017  . Hypercholesterolemia 05/13/2017  . Obesity with serious comorbidity 05/13/2017  . Family history of CHF (congestive heart failure) 05/13/2017  . Diabetes (HCC) 12/29/2011  . RUQ pain 12/29/2011  . Constipation 12/29/2011    Past Surgical History:  Procedure Laterality Date  . ABDOMINAL  HYSTERECTOMY    . APPENDECTOMY    . BACK SURGERY       OB History   No obstetric history on file.     Family History  Problem Relation Age of Onset  . Hypertension Mother   . Diabetes Mother   . Cancer Mother   . Cancer Father   . Diabetes Father   . Hypertension Father   . Heart murmur Sister   . Heart failure Child     Social History   Tobacco Use  . Smoking status: Never Smoker  . Smokeless tobacco: Never Used  Vaping Use  . Vaping Use: Never used  Substance Use Topics  . Alcohol use: No  . Drug use: No    Home Medications Prior to Admission medications   Medication Sig Start Date End Date Taking? Authorizing Provider  chlorpheniramine-HYDROcodone (TUSSIONEX PENNKINETIC ER) 10-8 MG/5ML SUER Take 5 mLs by mouth every 12 (twelve) hours as needed for cough. 05/10/20  Yes Harlene Salts A, PA-C  predniSONE (DELTASONE) 10 MG tablet Take 4 tablets (40 mg total) by mouth daily for 5 days. 05/10/20 05/15/20 Yes Harlene Salts A, PA-C  albuterol (PROVENTIL HFA;VENTOLIN HFA) 108 (90 BASE) MCG/ACT inhaler Inhale 1-2 puffs into the lungs every 6 (six) hours as needed for wheezing or shortness of breath. Patient taking differently: Inhale 2 puffs into the lungs every 6 (six) hours as needed for wheezing or shortness of breath.  11/21/13   Mellody Drown, PA-C  aspirin EC 81 MG tablet Take 81 mg by mouth daily.    [provider]  atorvastatin (LIPITOR) 40 MG tablet  Take 40 mg by mouth daily. 10/31/14   [provider]  beclomethasone (QVAR) 40 MCG/ACT inhaler Inhale 1 puff into the lungs daily.     [provider]  cyclobenzaprine (FLEXERIL) 10 MG tablet Take 1 tablet (10 mg total) by mouth 2 (two) times daily as needed for up to 20 doses for muscle spasms. Patient not taking: Reported on 08/10/2018 12/03/17   Virgina Norfolkuratolo, Adam, DO  cyclobenzaprine (FLEXERIL) 10 MG tablet Take 1 tablet (10 mg total) by mouth 3 (three) times daily as needed. 02/25/20   Joni ReiningSmith,  Ronald K, PA-C  dexlansoprazole (DEXILANT) 60 MG capsule Take 60 mg by mouth daily.    [provider]  diclofenac (VOLTAREN) 75 MG EC tablet Take 1 tablet (75 mg total) by mouth 2 (two) times daily. Patient not taking: Reported on 08/10/2018 04/13/16   Elvina SidleLauenstein, Kurt, MD  FLUoxetine (PROZAC) 40 MG capsule Take 40 mg by mouth daily.    [provider]  hydrochlorothiazide (HYDRODIURIL) 25 MG tablet TAKE 1 TABLET BY MOUTH EVERY DAY Patient not taking: Reported on 08/10/2018 02/16/18   Croitoru, Mihai, MD  ibuprofen (ADVIL) 600 MG tablet Take 1 tablet (600 mg total) by mouth every 8 (eight) hours as needed. 02/25/20   Joni ReiningSmith, Ronald K, PA-C  ibuprofen (ADVIL,MOTRIN) 800 MG tablet Take 1 tablet (800 mg total) by mouth every 8 (eight) hours as needed for up to 30 doses. Patient not taking: Reported on 08/10/2018 12/03/17   Virgina Norfolkuratolo, Adam, DO  Lifitegrast Benay Spice(XIIDRA) 5 % SOLN Place 1 drop into both eyes daily.    [provider]  Liraglutide (VICTOZA) 18 MG/3ML SOLN Inject 1.8 mg into the skin every morning.     [provider]  losartan (COZAAR) 100 MG tablet Take 100 mg by mouth daily.    [provider]  meloxicam (MOBIC) 15 MG tablet Take 15 mg by mouth daily.    [provider]  metFORMIN (GLUCOPHAGE-XR) 500 MG 24 hr tablet Take 500 mg by mouth daily. 03/03/16   [provider]  methocarbamol (ROBAXIN) 500 MG tablet Take 1 tablet (500 mg total) by mouth 2 (two) times daily. Patient not taking: Reported on 08/10/2018 12/08/17   Muthersbaugh, Dahlia ClientHannah, PA-C  montelukast (SINGULAIR) 10 MG tablet Take 10 mg by mouth every other day.     [provider]  ondansetron (ZOFRAN ODT) 8 MG disintegrating tablet Take 1 tablet (8 mg total) by mouth every 8 (eight) hours as needed for nausea or vomiting. Patient not taking: Reported on 08/10/2018 03/19/16   Street, CrawfordvilleMercedes, PA-C  ranitidine (ZANTAC) 150 MG tablet Take 1 tablet (150 mg total) by mouth 2  (two) times daily. Patient not taking: Reported on 08/10/2018 03/19/16   Street, AltoMercedes, PA-C  traMADol (ULTRAM) 50 MG tablet Take 1 tablet (50 mg total) by mouth every 6 (six) hours as needed. 04/13/16   Elvina SidleLauenstein, Kurt, MD  Vitamin D, Ergocalciferol, (DRISDOL) 50000 UNITS CAPS capsule Take 50,000 Units by mouth 2 (two) times a week. Tuesday and friday    [provider]    Allergies    Patient has no known allergies.  Review of Systems   Review of Systems Ten systems are reviewed and are negative for acute change except as noted in the HPI  Physical Exam Updated Vital Signs BP 138/75 (BP Location: Left Arm)   Pulse 86   Temp 98 F (36.7 C) (Oral)   Resp 20   Ht 5\' 5"  (1.651 m)  Wt 98 kg   SpO2 100%   BMI 35.94 kg/m   Physical Exam Constitutional:      General: She is not in acute distress.    Appearance: Normal appearance. She is well-developed. She is not ill-appearing or diaphoretic.  HENT:     Head: Normocephalic and atraumatic.     Comments: Mild posterior pharynx cobblestoning Eyes:     General: Vision grossly intact. Gaze aligned appropriately.     Pupils: Pupils are equal, round, and reactive to light.  Neck:     Trachea: Trachea and phonation normal.  Cardiovascular:     Rate and Rhythm: Normal rate and regular rhythm.  Pulmonary:     Effort: Pulmonary effort is normal. No respiratory distress.     Breath sounds: Normal breath sounds. Decreased air movement present.  Abdominal:     General: There is no distension.     Palpations: Abdomen is soft.     Tenderness: There is no abdominal tenderness. There is no guarding or rebound.  Musculoskeletal:        General: Normal range of motion.     Cervical back: Normal range of motion.     Right lower leg: No tenderness. No edema.     Left lower leg: No tenderness. No edema.  Skin:    General: Skin is warm and dry.  Neurological:     Mental Status: She is alert.     GCS: GCS eye subscore is 4. GCS  verbal subscore is 5. GCS motor subscore is 6.     Comments: Speech is clear and goal oriented, follows commands Major Cranial nerves without deficit, no facial droop Moves extremities without ataxia, coordination intact  Psychiatric:        Behavior: Behavior normal.     ED Results / Procedures / Treatments   Labs (all labs ordered are listed, but only abnormal results are displayed) Labs Reviewed  BASIC METABOLIC PANEL - Abnormal; Notable for the following components:      Result Value   Glucose, Bld 113 (*)    All other components within normal limits  I-STAT BETA HCG BLOOD, ED ([facility], [facility], [facility] ONLY) - Abnormal; Notable for the following components:   I-stat hCG, quantitative 6.9 (*)    All other components within normal limits  RESP PANEL BY RT-PCR (FLU A&B, COVID) ARPGX2  CBC  D-DIMER, QUANTITATIVE  POC URINE PREG, ED  TROPONIN I (HIGH SENSITIVITY)    EKG EKG Interpretation  Date/Time:  Sunday May 10 2020 16:21:09 EST Ventricular Rate:  81 PR Interval:    QRS Duration: 77 QT Interval:  359 QTC Calculation: 417 R Axis:   46 Text Interpretation: Sinus rhythm Borderline T abnormalities, anterior leads since last tracing no significant change Confirmed by Mancel Bale [id number]) on 05/10/2020 4:37:03 PM   Radiology DG Chest 2 View  Result Date: 05/10/2020 CLINICAL DATA:  Chest pain and shortness of breath EXAM: CHEST - 2 VIEW COMPARISON:  04/29/2018. FINDINGS: The cardiomediastinal contours are normal. The lungs are clear. Pulmonary vasculature is normal. No consolidation, pleural effusion, or pneumothorax. No acute osseous abnormalities are seen. Minimal thoracic spondylosis with endplate spurring. IMPRESSION: No acute chest findings. Electronically Signed   By: Narda Rutherford M.D.   On: 05/10/2020 16:29    Procedures Procedures   Medications Ordered in ED Medications  albuterol (VENTOLIN HFA) 108 (90 Base) MCG/ACT inhaler 2 puff (2 puffs Inhalation Given 05/10/20 1706)   ipratropium-albuterol (DUONEB) 0.5-2.5 (3) MG/3ML nebulizer solution 3  mL (3 mLs Nebulization Given 05/10/20 1855)    ED Course  I have reviewed the triage vital signs and the nursing notes.  Pertinent labs & imaging results that were available during my care of the patient were reviewed by me and considered in my medical decision making (see chart for details).  Clinical Course as of 05/10/20 1944  Wynelle Link May 10, 2020  1918 Tussionex [BM]    Clinical Course User Index [BM] Elizabeth Palau   MDM Rules/Calculators/A&P                         Additional history obtained from: 1. Nursing notes from this visit. 2. Electronic medical record review. ----------------------- 55 year old female history as above presented for chest pain shortness of breath x2 days after experiencing URI symptoms.  On exam she is in no acute distress cardiopulmonary exam is unremarkable somewhat diminished lung sounds.  Moderate rhinorrhea and postnasal drip present.  No abdominal tenderness on exam no extremity swelling or evidence of DVT.  Unable to use PERC criteria due to age.  Chest pain work-up initiated in triage I have added on D-dimer and Covid test. - I ordered, reviewed and interpreted labs which include: CBC within normal limits, no leukocytosis to suggest infectious process, no anemia. BMP shows no emergent electrolyte derangement, AKI or gap. High-sensitivity troponin negative, onset of symptoms 2 days ago, no indication for delta at this time. I-STAT beta-hCG 6.9 suspect false positive will obtain follow-up urine pregnancy. D-dimer within normal limits, doubt pulmonary embolism.  CXR:    IMPRESSION:  No acute chest findings.   EKG: Sinus rhythm Borderline T abnormalities, anterior leads since last tracing no significant change Confirmed by Mancel Bale [phone]) on 05/10/2020 4:37:03 PM  COVID/Influenza panel negative -------------------- Patient ambulated with nursing staff without  hypoxia on room air.  Patient was reassessed she is lying comfortably in bed no acute distress vital signs are within normal limits.  She reports some continued shortness of breath she feels a similar to previous asthma attacks, will attempt DuoNeb and monitor. - Patient reports feeling improved after DuoNeb she is requesting discharge.  Suspect patient with bronchitis today secondary to recent URI.  She has no history of COPD there is no indication for antibiotics at this time.  Additionally patient does have risk factors for heart disease but symptoms appear to be related to her URI and low suspicion for ACS.  Will encourage patient to follow-up closely with her PCP for further evaluation.  Doubt ACS, PE, dissection, bacterial pneumonia, PTX, endocarditis/pericarditis, anemia or other emergent cardiopulmonary etiology of her symptoms today.  PDMP reviewed, patient without opioid prescriptions this year.  Will prescribe patient short course, 30 mL Tussionex (3-day course).  Narcotic precautions discussed with patient she states understanding that she is not to drive, drink alcohol or perform any potentially dangerous activities as this of medication will make her drowsy.  Additionally will prescribe patient short 5-day burst prednisone for bronchitis, patient is non-insulin-dependent diabetic advised to monitor her sugars closely and follow-up with her PCP.  Encouraged increased water intake to avoid dehydration and limitation of carbohydrates.  At this time there does not appear to be any evidence of an acute emergency medical condition and the patient appears stable for discharge with appropriate outpatient follow up. Diagnosis was discussed with patient who verbalizes understanding of care plan and is agreeable to discharge. I have discussed return precautions with patient who verbalizes understanding. Patient  encouraged to follow-up with their PCP. All questions answered.  Patient seen and evaluated by  Dr. Effie Shy during this visit who agrees with discharge with prednisone/Tussionex and PCP follow-up  Note: Portions of this report may have been transcribed using voice recognition software. Every effort was made to ensure accuracy; however, inadvertent computerized transcription errors may still be present. Final Clinical Impression(s) / ED Diagnoses Final diagnoses:  Bronchitis    Rx / DC Orders ED Discharge Orders         Ordered    chlorpheniramine-HYDROcodone (TUSSIONEX PENNKINETIC ER) 10-8 MG/5ML SUER  Every 12 hours PRN        05/10/20 1928    predniSONE (DELTASONE) 10 MG tablet  Daily        05/10/20 1928           Elizabeth Palau 05/10/20 1944    Mancel Bale, MD 05/11/20 1615

## 2020-05-10 NOTE — ED Provider Notes (Signed)
  Face-to-face evaluation   History: She presents for evaluation of shortness of breath with cough, nonproductive, but it causes her to urinate.  She denies fever, chills, nausea vomiting or diaphoresis.  She has been using her albuterol inhaler at home without relief.  No known sick contacts.  Physical exam: Obese alert female.  Persistent cough during exam.  Lungs with scattered rhonchi, and somewhat decreased expiratory air movement but no audible wheezes or rails.  No increased work of breathing.  Medical screening examination/treatment/procedure(s) were conducted as a shared visit with non-physician practitioner(s) and myself.  I personally evaluated the patient during the encounter    Mancel Bale, MD 05/11/20 1615

## 2020-05-10 NOTE — ED Triage Notes (Signed)
patient c/o intermittent left chest pain and SOB x 2 days. patient also c/o acid reflux and asthma. patient states she has been using Albuterol inhaler with no relief.

## 2020-07-15 ENCOUNTER — Other Ambulatory Visit: Payer: Self-pay

## 2020-07-15 ENCOUNTER — Encounter (INDEPENDENT_AMBULATORY_CARE_PROVIDER_SITE_OTHER): Payer: Self-pay | Admitting: Bariatrics

## 2020-07-15 ENCOUNTER — Ambulatory Visit (INDEPENDENT_AMBULATORY_CARE_PROVIDER_SITE_OTHER): Payer: BC Managed Care – PPO | Admitting: Bariatrics

## 2020-07-15 VITALS — BP 116/81 | HR 75 | Temp 97.9°F | Ht 65.0 in | Wt 217.0 lb

## 2020-07-15 DIAGNOSIS — I10 Essential (primary) hypertension: Secondary | ICD-10-CM

## 2020-07-15 DIAGNOSIS — Z0289 Encounter for other administrative examinations: Secondary | ICD-10-CM

## 2020-07-15 DIAGNOSIS — Z9189 Other specified personal risk factors, not elsewhere classified: Secondary | ICD-10-CM | POA: Diagnosis not present

## 2020-07-15 DIAGNOSIS — E559 Vitamin D deficiency, unspecified: Secondary | ICD-10-CM | POA: Diagnosis not present

## 2020-07-15 DIAGNOSIS — E669 Obesity, unspecified: Secondary | ICD-10-CM

## 2020-07-15 DIAGNOSIS — R0602 Shortness of breath: Secondary | ICD-10-CM

## 2020-07-15 DIAGNOSIS — R5383 Other fatigue: Secondary | ICD-10-CM

## 2020-07-15 DIAGNOSIS — Z1331 Encounter for screening for depression: Secondary | ICD-10-CM

## 2020-07-15 DIAGNOSIS — K219 Gastro-esophageal reflux disease without esophagitis: Secondary | ICD-10-CM

## 2020-07-15 DIAGNOSIS — E7800 Pure hypercholesterolemia, unspecified: Secondary | ICD-10-CM

## 2020-07-15 DIAGNOSIS — E1169 Type 2 diabetes mellitus with other specified complication: Secondary | ICD-10-CM

## 2020-07-15 DIAGNOSIS — Z6836 Body mass index (BMI) 36.0-36.9, adult: Secondary | ICD-10-CM

## 2020-07-16 ENCOUNTER — Encounter (INDEPENDENT_AMBULATORY_CARE_PROVIDER_SITE_OTHER): Payer: Self-pay | Admitting: Bariatrics

## 2020-07-16 LAB — COMPREHENSIVE METABOLIC PANEL
ALT: 19 IU/L (ref 0–32)
AST: 16 IU/L (ref 0–40)
Albumin/Globulin Ratio: 1.6 (ref 1.2–2.2)
Albumin: 4.2 g/dL (ref 3.8–4.9)
Alkaline Phosphatase: 92 IU/L (ref 44–121)
BUN/Creatinine Ratio: 8 — ABNORMAL LOW (ref 9–23)
BUN: 7 mg/dL (ref 6–24)
Bilirubin Total: 0.3 mg/dL (ref 0.0–1.2)
CO2: 26 mmol/L (ref 20–29)
Calcium: 9.9 mg/dL (ref 8.7–10.2)
Chloride: 100 mmol/L (ref 96–106)
Creatinine, Ser: 0.84 mg/dL (ref 0.57–1.00)
Globulin, Total: 2.7 g/dL (ref 1.5–4.5)
Glucose: 91 mg/dL (ref 65–99)
Potassium: 4.4 mmol/L (ref 3.5–5.2)
Sodium: 142 mmol/L (ref 134–144)
Total Protein: 6.9 g/dL (ref 6.0–8.5)
eGFR: 84 mL/min/{1.73_m2} (ref >59)

## 2020-07-16 LAB — TSH: TSH: 1.01 u[IU]/mL (ref 0.450–4.500)

## 2020-07-16 LAB — HEMOGLOBIN A1C
Est. average glucose Bld gHb Est-mCnc: 143 mg/dL
Hgb A1c MFr Bld: 6.6 % — ABNORMAL HIGH (ref 4.8–5.6)

## 2020-07-16 LAB — INSULIN, RANDOM: INSULIN: 12.3 u[IU]/mL (ref 2.6–24.9)

## 2020-07-16 LAB — T4, FREE: Free T4: 1.18 ng/dL (ref 0.82–1.77)

## 2020-07-16 LAB — LIPID PANEL WITH LDL/HDL RATIO
Cholesterol, Total: 175 mg/dL (ref 100–199)
HDL: 42 mg/dL (ref >39)
LDL Chol Calc (NIH): 110 mg/dL — ABNORMAL HIGH (ref 0–99)
LDL/HDL Ratio: 2.6 ratio (ref 0.0–3.2)
Triglycerides: 130 mg/dL (ref 0–149)
VLDL Cholesterol Cal: 23 mg/dL (ref 5–40)

## 2020-07-16 LAB — VITAMIN D 25 HYDROXY (VIT D DEFICIENCY, FRACTURES): Vit D, 25-Hydroxy: 116 ng/mL — ABNORMAL HIGH (ref 30.0–100.0)

## 2020-07-16 LAB — MICROALBUMIN / CREATININE URINE RATIO
Creatinine, Urine: 141.5 mg/dL
Microalb/Creat Ratio: 4 mg/g creat (ref 0–29)
Microalbumin, Urine: 6 ug/mL

## 2020-07-16 LAB — T3: T3, Total: 120 ng/dL (ref 71–180)

## 2020-07-16 LAB — C-PEPTIDE: C-Peptide: 3.2 ng/mL (ref 1.1–4.4)

## 2020-07-16 NOTE — Progress Notes (Signed)
Pt advised.

## 2020-07-16 NOTE — Progress Notes (Signed)
Chief Complaint:   OBESITY Kristen Holland (MR# [id number]) is a 55 y.o. female who presents for evaluation and treatment of obesity and related comorbidities. Current BMI is Body mass index is 36.11 kg/m. Kristen Holland has been struggling with her weight for many years and has been unsuccessful in either losing weight, maintaining weight loss, or reaching her healthy weight goal.  Kristen Holland is currently in the action stage of change and ready to dedicate time achieving and maintaining a healthier weight. Kristen Holland is interested in becoming our patient and working on intensive lifestyle modifications including (but not limited to) diet and exercise for weight loss.  Kristen Holland does not like to cook. She craves sugary drinks and carbohydrates. She considers herself a picky eater.  Kristen Holland's habits were reviewed today and are as follows: her desired weight loss is 72 lbs, she has been heavy most of her life, she started gaining weight when her mother passed away, her heaviest weight ever was 233 pounds, she is a picky eater and doesn't like to eat healthier foods, she has significant food cravings issues, she snacks frequently in the evenings, she skips meals frequently, she is frequently drinking liquids with calories, she frequently makes poor food choices and she struggles with emotional eating.  Depression Screen Kristen Holland's Food and Mood (modified PHQ-9) score was 18.  Depression screen PHQ 2/9 07/15/2020  Decreased Interest 2  Down, Depressed, Hopeless 1  PHQ - 2 Score 3  Altered sleeping 2  Tired, decreased energy 3  Change in appetite 3  Feeling bad or failure about yourself  2  Trouble concentrating 3  Moving slowly or fidgety/restless 2  Suicidal thoughts 0  PHQ-9 Score 18  Difficult doing work/chores Very difficult   Subjective:   1. Other fatigue Kristen Holland admits to daytime somnolence and admits to waking up still tired. Patent has a history of symptoms of daytime fatigue, morning fatigue and  morning headache. Kristen Holland generally gets 4 or 5 hours of sleep per night, and states that she has poor sleep quality. Snoring is present when she doesn't use her CPAP. Apneic episodes are present when she does not wear her CPAP. Epworth Sleepiness Score is 14.  2. SOB (shortness of breath) on exertion Kristen Holland notes increasing shortness of breath with exercising and seems to be worsening over time with weight gain. She notes getting out of breath sooner with activity than she used to. This has gotten worse recently. Kristen Holland denies shortness of breath at rest or orthopnea.  3. Diabetes mellitus type 2 in obese (HCC) Kristen Holland is taking Metformin.  4. Essential hypertension Kristen Holland is taking Diovan HCT, HCTZ and Avapro. Her BP is reasonably well controlled.  5. Hypercholesterolemia Kristen Holland is taking Lipitor.  6. Gastroesophageal reflux disease, unspecified whether esophagitis present Kristen Holland is taking Dexilant.  7. Vitamin D deficiency Kristen Holland is taking high dose prescription Vit D.  8. At risk for activity intolerance Kristen Holland is at risk for exercise intolerance due to obesity.  Assessment/Plan:   1. Other fatigue Kristen Holland does feel that her weight is causing her energy to be lower than it should be. Fatigue may be related to obesity, depression or many other causes. Labs will be ordered, and in the meanwhile, Kristen Holland will focus on self care including making healthy food choices, increasing physical activity and focusing on stress reduction. Check labs today.  - EKG 12-Lead - Comprehensive metabolic panel - Hemoglobin A1c - Insulin, random - Lipid Panel With LDL/HDL Ratio - T3 - T4,  free - TSH  2. SOB (shortness of breath) on exertion Kristen Holland does feel that she gets out of breath more easily that she used to when she exercises. Kristen Holland's shortness of breath appears to be obesity related and exercise induced. She has agreed to work on weight loss and gradually increase exercise to treat her  exercise induced shortness of breath. Will continue to monitor closely. Check labs today.  - Lipid Panel With LDL/HDL Ratio  3. Diabetes mellitus type 2 in obese (HCC) Good blood sugar control is important to decrease the likelihood of diabetic complications such as nephropathy, neuropathy, limb loss, blindness, coronary artery disease, and death. Intensive lifestyle modification including diet, exercise and weight loss are the first line of treatment for diabetes. Check labs today.  - Hemoglobin A1c - Insulin, random - Microalbumin / creatinine urine ratio - C-peptide  4. Essential hypertension Kristen Holland is working on healthy weight loss and exercise to improve blood pressure control. We will watch for signs of hypotension as she continues her lifestyle modifications. Check labs today.  - Lipid Panel With LDL/HDL Ratio  5. Hypercholesterolemia Cardiovascular risk and specific lipid/LDL goals reviewed.  We discussed several lifestyle modifications today and Kristen Holland will continue to work on diet, exercise and weight loss efforts. Orders and follow up as documented in patient record. Check labs today.  Counseling Intensive lifestyle modifications are the first line treatment for this issue. . Dietary changes: Increase soluble fiber. Decrease simple carbohydrates. . Exercise changes: Moderate to vigorous-intensity aerobic activity 150 minutes per week if tolerated. . Lipid-lowering medications: see documented in medical record. - Lipid Panel With LDL/HDL Ratio  6. Gastroesophageal reflux disease, unspecified whether esophagitis present Intensive lifestyle modifications are the first line treatment for this issue. We discussed several lifestyle modifications today and she will continue to work on diet, exercise and weight loss efforts. Orders and follow up as documented in patient record. Continue current treatment plan.  Counseling . If a person has gastroesophageal reflux disease (GERD),  food and stomach acid move back up into the esophagus and cause symptoms or problems such as damage to the esophagus. . Anti-reflux measures include: raising the head of the bed, avoiding tight clothing or belts, avoiding eating late at night, not lying down shortly after mealtime, and achieving weight loss. . Avoid ASA, NSAID's, caffeine, alcohol, and tobacco.  . OTC Pepcid and/or Tums are often very helpful for as needed use.  Marland Kitchen. However, for persisting chronic or daily symptoms, stronger medications like Omeprazole may be needed. . You may need to avoid foods and drinks such as: ? Coffee and tea (with or without caffeine). ? Drinks that contain alcohol. ? Energy drinks and sports drinks. ? Bubbly (carbonated) drinks or sodas. ? Chocolate and cocoa. ? Peppermint and mint flavorings. ? Garlic and onions. ? Horseradish. ? Spicy and acidic foods. These include peppers, chili powder, curry powder, vinegar, hot sauces, and BBQ sauce. ? Citrus fruit juices and citrus fruits, such as oranges, lemons, and limes. ? Tomato-based foods. These include red sauce, chili, salsa, and pizza with red sauce. ? Fried and fatty foods. These include donuts, french fries, potato chips, and high-fat dressings. ? High-fat meats. These include hot dogs, rib eye steak, sausage, ham, and bacon.  7. Vitamin D deficiency Low Vitamin D level contributes to fatigue and are associated with obesity, breast, and colon cancer. She agrees to continue to take prescription Vitamin D @50 ,000 IU every week and will follow-up for routine testing of Vitamin D,  at least 2-3 times per year to avoid over-replacement. Check labs today.  - VITAMIN D 25 Hydroxy (Vit-D Deficiency, Fractures)  8. Depression screen Kristen Holland had a positive depression screening. Depression is commonly associated with obesity and often results in emotional eating behaviors. We will monitor this closely and work on CBT to help improve the non-hunger eating  patterns. Referral to Psychology may be required if no improvement is seen as she continues in our clinic.  9. At risk for activity intolerance Kristen Holland was given approximately 15 minutes of exercise intolerance counseling today. She is 55 y.o. female and has risk factors exercise intolerance including obesity. We discussed intensive lifestyle modifications today with an emphasis on specific weight loss instructions and strategies. Kristen Holland will slowly increase activity as tolerated.  Repetitive spaced learning was employed today to elicit superior memory formation and behavioral change.  10. Class 2 severe obesity with serious comorbidity and body mass index (BMI) of 36.0 to 36.9 in adult, unspecified obesity type (HCC)  Kristen Holland is currently in the action stage of change and her goal is to continue with weight loss efforts. I recommend Kristen Holland begin the structured treatment plan as follows:  She has agreed to the Category 1 Plan.  05/10/2020 labs reviewed  Exercise goals: No exercise has been prescribed at this time.   Behavioral modification strategies: increasing lean protein intake, decreasing simple carbohydrates, increasing vegetables, increasing water intake, decreasing eating out, no skipping meals, meal planning and cooking strategies, keeping healthy foods in the home and planning for success.  She was informed of the importance of frequent follow-up visits to maximize her success with intensive lifestyle modifications for her multiple health conditions. She was informed we would discuss her lab results at her next visit unless there is a critical issue that needs to be addressed sooner. Kristen Holland agreed to keep her next visit at the agreed upon time to discuss these results.  Objective:   Blood pressure 116/81, pulse 75, temperature 97.9 F (36.6 C), height 5\' 5"  (1.651 m), weight 217 lb (98.4 kg), SpO2 97 %. Body mass index is 36.11 kg/m.  EKG: Normal sinus rhythm, rate 72. Nonspecific  T-abnormality  Indirect Calorimeter completed today shows a VO2 of 155 and a REE of 1078.  Her calculated basal metabolic rate is thus her basal metabolic rate is worse than expected.  General: Cooperative, alert, well developed, in no acute distress. HEENT: Conjunctivae and lids unremarkable. Cardiovascular: Regular rhythm.  Lungs: Normal work of breathing. Neurologic: No focal deficits.   Lab Results  Component Value Date   CREATININE 0.84 07/15/2020   BUN 7 07/15/2020   NA 142 07/15/2020   K 4.4 07/15/2020   CL 100 07/15/2020   CO2 26 07/15/2020   Lab Results  Component Value Date   ALT 19 07/15/2020   AST 16 07/15/2020   ALKPHOS 92 07/15/2020   BILITOT 0.3 07/15/2020   Lab Results  Component Value Date   HGBA1C 6.6 (H) 07/15/2020   HGBA1C 5.3 07/15/2012   Lab Results  Component Value Date   INSULIN 12.3 07/15/2020   Lab Results  Component Value Date   TSH 1.010 07/15/2020   Lab Results  Component Value Date   CHOL 175 07/15/2020   HDL 42 07/15/2020   LDLCALC 110 (H) 07/15/2020   TRIG 130 07/15/2020   Lab Results  Component Value Date   WBC 8.7 05/10/2020   HGB 12.5 05/10/2020   HCT 39.2 05/10/2020   MCV 88.5 05/10/2020  PLT 372 05/10/2020    Attestation Statements:   Reviewed by clinician on day of visit: allergies, medications, problem list, medical history, surgical history, family history, social history, and previous encounter notes.  Edmund Hilda, CMA, am acting as Energy manager for [employer], DO.  I have reviewed the above documentation for accuracy and completeness, and I agree with the above. Corinna Capra, DO

## 2020-07-29 ENCOUNTER — Ambulatory Visit (INDEPENDENT_AMBULATORY_CARE_PROVIDER_SITE_OTHER): Payer: BC Managed Care – PPO | Admitting: Bariatrics

## 2020-07-29 ENCOUNTER — Other Ambulatory Visit: Payer: Self-pay

## 2020-07-29 ENCOUNTER — Encounter (INDEPENDENT_AMBULATORY_CARE_PROVIDER_SITE_OTHER): Payer: Self-pay | Admitting: Bariatrics

## 2020-07-29 VITALS — BP 120/82 | HR 67 | Temp 97.6°F | Ht 65.0 in | Wt 209.0 lb

## 2020-07-29 DIAGNOSIS — E669 Obesity, unspecified: Secondary | ICD-10-CM | POA: Diagnosis not present

## 2020-07-29 DIAGNOSIS — E1169 Type 2 diabetes mellitus with other specified complication: Secondary | ICD-10-CM | POA: Diagnosis not present

## 2020-07-29 DIAGNOSIS — Z9189 Other specified personal risk factors, not elsewhere classified: Secondary | ICD-10-CM

## 2020-07-29 DIAGNOSIS — E559 Vitamin D deficiency, unspecified: Secondary | ICD-10-CM

## 2020-07-29 DIAGNOSIS — Z6836 Body mass index (BMI) 36.0-36.9, adult: Secondary | ICD-10-CM

## 2020-07-29 DIAGNOSIS — E785 Hyperlipidemia, unspecified: Secondary | ICD-10-CM

## 2020-08-04 ENCOUNTER — Encounter (INDEPENDENT_AMBULATORY_CARE_PROVIDER_SITE_OTHER): Payer: Self-pay | Admitting: Bariatrics

## 2020-08-04 NOTE — Progress Notes (Signed)
Chief Complaint:   OBESITY Kristen Holland is here to discuss her progress with her obesity treatment plan along with follow-up of her obesity related diagnoses. Kristen Holland is on the Category 1 Plan and states she is following her eating plan approximately 100% of the time. Kristen Holland states she is biking for 10 minutes 2 times per week.  Today's visit was #: 2 Starting weight: 217 lbs Starting date: 07/15/2020 Today's weight: 209 lbs Today's date: 07/29/2020 Total lbs lost to date: 8 Total lbs lost since last in-office visit: 8  Interim History: Kristen Holland is down 8 lbs since her last visit. She has been struggling with simple carbohydrates.  Subjective:   1. Vitamin D deficiency Kristen Holland is taking Vit D, and last Vit D level was 116.0.  2. Diabetes mellitus type 2 in obese (HCC) Kristen Holland is taking metformin, and her last A1c was 6.6 and insulin 12.3.  3. Hyperlipidemia associated with type 2 diabetes mellitus (HCC) Kristen Holland is taking Lipitor currently.  4. At risk for heart disease Kristen Holland is at a higher than average risk for cardiovascular disease due to obesity, diabetes mellitus, and hyperlipidemia.   Assessment/Plan:   1. Vitamin D deficiency Low Vitamin D level contributes to fatigue and are associated with obesity, breast, and colon cancer. We have reached out to the patient via phone call to have her discontinue Vit D. Kristen Holland will follow-up for routine testing of Vitamin D, at least 2-3 times per year to avoid over-replacement.  2. Diabetes mellitus type 2 in obese [facility]) Karter will continue metformin. Good blood sugar control is important to decrease the likelihood of diabetic complications such as nephropathy, neuropathy, limb loss, blindness, coronary artery disease, and death. Intensive lifestyle modification including diet, exercise and weight loss are the first line of treatment for diabetes.   3. Hyperlipidemia associated with type 2 diabetes mellitus (HCC) Cardiovascular risk and  specific lipid/LDL goals reviewed.  e discussed several lifestyle modifications today. Kristen Holland will continue Lipitor, and will continue to work on diet, exercise and weight loss efforts. Orders and follow up as documented in patient record.   4. At risk for heart disease Kristen Holland was given approximately 15 minutes of coronary artery disease prevention counseling today. She is 55 y.o. female and has risk factors for heart disease including obesity. We discussed intensive lifestyle modifications today with an emphasis on specific weight loss instructions and strategies.   Repetitive spaced learning was employed today to elicit superior memory formation and behavioral change.  5. Obesity, current BMI 34 Kristen Holland is currently in the action stage of change. As such, her goal is to continue with weight loss efforts. She has agreed to the Category 1 Plan.   I discussed labs from 07/15/2020 with the patient today. Seasonings and pure cane was discussed. Shake recipes was given.  Exercise goals: As is.  Behavioral modification strategies: increasing lean protein intake, decreasing simple carbohydrates, increasing vegetables, increasing water intake, decreasing eating out, no skipping meals, meal planning and cooking strategies, keeping healthy foods in the home and planning for success.  Kristen Holland has agreed to follow-up with our clinic in 2 weeks. She was informed of the importance of frequent follow-up visits to maximize her success with intensive lifestyle modifications for her multiple health conditions.   Objective:   Blood pressure 120/82, pulse 67, temperature 97.6 F (36.4 C), height 5\' 5"  (1.651 m), weight 209 lb (94.8 kg), SpO2 100 %. Body mass index is 34.78 kg/m.  General: Cooperative, alert, well developed, in  no acute distress. HEENT: Conjunctivae and lids unremarkable. Cardiovascular: Regular rhythm.  Lungs: Normal work of breathing. Neurologic: No focal deficits.   Lab Results   Component Value Date   CREATININE 0.84 07/15/2020   BUN 7 07/15/2020   NA 142 07/15/2020   K 4.4 07/15/2020   CL 100 07/15/2020   CO2 26 07/15/2020   Lab Results  Component Value Date   ALT 19 07/15/2020   AST 16 07/15/2020   ALKPHOS 92 07/15/2020   BILITOT 0.3 07/15/2020   Lab Results  Component Value Date   HGBA1C 6.6 (H) 07/15/2020   HGBA1C 5.3 07/15/2012   Lab Results  Component Value Date   INSULIN 12.3 07/15/2020   Lab Results  Component Value Date   TSH 1.010 07/15/2020   Lab Results  Component Value Date   CHOL 175 07/15/2020   HDL 42 07/15/2020   LDLCALC 110 (H) 07/15/2020   TRIG 130 07/15/2020   Lab Results  Component Value Date   WBC 8.7 05/10/2020   HGB 12.5 05/10/2020   HCT 39.2 05/10/2020   MCV 88.5 05/10/2020   PLT 372 05/10/2020   No results found for: IRON, TIBC, FERRITIN  Attestation Statements:   Reviewed by clinician on day of visit: allergies, medications, problem list, medical history, surgical history, family history, social history, and previous encounter notes.   Trude Mcburney, am acting as Energy manager for [employer], DO.  I have reviewed the above documentation for accuracy and completeness, and I agree with the above. Corinna Capra, DO

## 2020-08-12 ENCOUNTER — Other Ambulatory Visit: Payer: Self-pay | Admitting: Orthopedic Surgery

## 2020-08-12 DIAGNOSIS — M545 Low back pain, unspecified: Secondary | ICD-10-CM

## 2020-08-19 ENCOUNTER — Ambulatory Visit (INDEPENDENT_AMBULATORY_CARE_PROVIDER_SITE_OTHER): Payer: BC Managed Care – PPO | Admitting: Bariatrics

## 2020-08-25 ENCOUNTER — Other Ambulatory Visit: Payer: Self-pay

## 2020-08-25 ENCOUNTER — Ambulatory Visit (INDEPENDENT_AMBULATORY_CARE_PROVIDER_SITE_OTHER): Payer: BC Managed Care – PPO | Admitting: Bariatrics

## 2020-08-25 ENCOUNTER — Encounter (INDEPENDENT_AMBULATORY_CARE_PROVIDER_SITE_OTHER): Payer: Self-pay | Admitting: Bariatrics

## 2020-08-25 VITALS — BP 105/70 | HR 91 | Temp 98.0°F | Ht 65.0 in | Wt 207.0 lb

## 2020-08-25 DIAGNOSIS — E669 Obesity, unspecified: Secondary | ICD-10-CM

## 2020-08-25 DIAGNOSIS — E785 Hyperlipidemia, unspecified: Secondary | ICD-10-CM

## 2020-08-25 DIAGNOSIS — E1169 Type 2 diabetes mellitus with other specified complication: Secondary | ICD-10-CM

## 2020-08-25 DIAGNOSIS — Z6836 Body mass index (BMI) 36.0-36.9, adult: Secondary | ICD-10-CM

## 2020-08-27 ENCOUNTER — Ambulatory Visit
Admission: RE | Admit: 2020-08-27 | Discharge: 2020-08-27 | Disposition: A | Discharge status: Home / Self Care | Payer: No Typology Code available for payment source | Source: Ambulatory Visit | Attending: Orthopedic Surgery | Admitting: Orthopedic Surgery

## 2020-08-27 ENCOUNTER — Ambulatory Visit
Admission: RE | Admit: 2020-08-27 | Discharge: 2020-08-27 | Disposition: A | Discharge status: Home / Self Care | Payer: BC Managed Care – PPO | Source: Ambulatory Visit | Attending: Orthopedic Surgery | Admitting: Orthopedic Surgery

## 2020-08-27 DIAGNOSIS — M545 Low back pain, unspecified: Secondary | ICD-10-CM

## 2020-08-27 MED ORDER — METHYLPREDNISOLONE ACETATE 40 MG/ML INJ SUSP (RADIOLOG
80.0000 mg | Freq: Once | INTRAMUSCULAR | Status: AC
  Administered 2020-08-27: 80 mg via INTRA_ARTICULAR

## 2020-09-01 ENCOUNTER — Encounter (INDEPENDENT_AMBULATORY_CARE_PROVIDER_SITE_OTHER): Payer: Self-pay | Admitting: Bariatrics

## 2020-09-01 NOTE — Progress Notes (Signed)
Chief Complaint:   OBESITY Kristen Holland is here to discuss her progress with her obesity treatment plan along with follow-up of her obesity related diagnoses. Kristen Holland is on the Category 1 Plan and states she is following her eating plan approximately 0% of the time. Kristen Holland states she is on the treadmill and bike for 20 minutes 5 times per week.  Today's visit was #: 3 Starting weight: 217 lbs Starting date: 07/15/2020 Today's weight: 207 lbs Today's date: 08/25/2020 Total lbs lost to date: 10 Total lbs lost since last in-office visit: 2  Interim History: Kristen Holland is down 2 lbs. She is getting in adequate protein.  Subjective:   1. Diabetes mellitus type 2 in obese (HCC) Kristen Holland is taking metformin XR.  2. Hyperlipidemia associated with type 2 diabetes mellitus (HCC) Kristen Holland is taking Lipitor.  Assessment/Plan:   1. Diabetes mellitus type 2 in obese [facility]) [name] will continue her medications.Good blood sugar control is important to decrease the likelihood of diabetic complications such as nephropathy, neuropathy, limb loss, blindness, coronary artery disease, and death. Intensive lifestyle modification including diet, exercise and weight loss are the first line of treatment for diabetes.   2. Hyperlipidemia associated with type 2 diabetes mellitus (HCC) Cardiovascular risk and specific lipid/LDL goals reviewed. We discussed several lifestyle modifications today. Kristen Holland will continue her medications, and will continue to work on diet, exercise and weight loss efforts. Orders and follow up as documented in patient record.   Counseling Intensive lifestyle modifications are the first line treatment for this issue. Dietary changes: Increase soluble fiber. Decrease simple carbohydrates. Exercise changes: Moderate to vigorous-intensity aerobic activity 150 minutes per week if tolerated. Lipid-lowering medications: see documented in medical record.  3. Obesity, current BMI 34.5 Karlissa is  currently in the action stage of change. As such, her goal is to continue with weight loss efforts. She has agreed to the Category 1 Plan.   Exercise goals: As is.  Behavioral modification strategies: increasing lean protein intake, decreasing simple carbohydrates, increasing vegetables, increasing water intake, decreasing eating out, no skipping meals, meal planning and cooking strategies, keeping healthy foods in the home, and planning for success.  Kristen Holland has agreed to follow-up with our clinic in 2 weeks. She was informed of the importance of frequent follow-up visits to maximize her success with intensive lifestyle modifications for her multiple health conditions.   Objective:   Blood pressure 105/70, pulse 91, temperature 98 F (36.7 C), height 5\' 5"  (1.651 m), weight 207 lb (93.9 kg), SpO2 97 %. Body mass index is 34.45 kg/m.  General: Cooperative, alert, well developed, in no acute distress. HEENT: Conjunctivae and lids unremarkable. Cardiovascular: Regular rhythm.  Lungs: Normal work of breathing. Neurologic: No focal deficits.   Lab Results  Component Value Date   CREATININE 0.84 07/15/2020   BUN 7 07/15/2020   NA 142 07/15/2020   K 4.4 07/15/2020   CL 100 07/15/2020   CO2 26 07/15/2020   Lab Results  Component Value Date   ALT 19 07/15/2020   AST 16 07/15/2020   ALKPHOS 92 07/15/2020   BILITOT 0.3 07/15/2020   Lab Results  Component Value Date   HGBA1C 6.6 (H) 07/15/2020   HGBA1C 5.3 07/15/2012   Lab Results  Component Value Date   INSULIN 12.3 07/15/2020   Lab Results  Component Value Date   TSH 1.010 07/15/2020   Lab Results  Component Value Date   CHOL 175 07/15/2020   HDL 42 07/15/2020   LDLCALC  110 (H) 07/15/2020   TRIG 130 07/15/2020   Lab Results  Component Value Date   WBC 8.7 05/10/2020   HGB 12.5 05/10/2020   HCT 39.2 05/10/2020   MCV 88.5 05/10/2020   PLT 372 05/10/2020   No results found for: IRON, TIBC, FERRITIN  Attestation  Statements:   Reviewed by clinician on day of visit: allergies, medications, problem list, medical history, surgical history, family history, social history, and previous encounter notes.  Time spent on visit including pre-visit chart review and post-visit care and charting was 20 minutes.    Trude Mcburney, am acting as Energy manager for [employer], DO.  I have reviewed the above documentation for accuracy and completeness, and I agree with the above. Corinna Capra, DO

## 2020-09-14 ENCOUNTER — Ambulatory Visit (INDEPENDENT_AMBULATORY_CARE_PROVIDER_SITE_OTHER): Payer: 59 | Admitting: Physician Assistant

## 2020-09-22 ENCOUNTER — Other Ambulatory Visit: Payer: Self-pay

## 2020-09-22 ENCOUNTER — Encounter (INDEPENDENT_AMBULATORY_CARE_PROVIDER_SITE_OTHER): Payer: Self-pay | Admitting: Physician Assistant

## 2020-09-22 ENCOUNTER — Ambulatory Visit (INDEPENDENT_AMBULATORY_CARE_PROVIDER_SITE_OTHER): Payer: BC Managed Care – PPO | Admitting: Physician Assistant

## 2020-09-22 VITALS — BP 94/62 | HR 87 | Temp 97.9°F | Ht 65.0 in | Wt 204.0 lb

## 2020-09-22 DIAGNOSIS — Z6836 Body mass index (BMI) 36.0-36.9, adult: Secondary | ICD-10-CM

## 2020-09-22 DIAGNOSIS — E669 Obesity, unspecified: Secondary | ICD-10-CM

## 2020-09-22 DIAGNOSIS — E1169 Type 2 diabetes mellitus with other specified complication: Secondary | ICD-10-CM

## 2020-09-26 ENCOUNTER — Other Ambulatory Visit: Payer: Self-pay | Admitting: Orthopedic Surgery

## 2020-09-26 DIAGNOSIS — M25512 Pain in left shoulder: Secondary | ICD-10-CM

## 2020-09-29 NOTE — Progress Notes (Signed)
Chief Complaint:   OBESITY Kristen Holland is here to discuss her progress with her obesity treatment plan along with follow-up of her obesity related diagnoses. Kristen Holland is on the Category 1 Plan and states she is following her eating plan approximately 50% of the time. Kristen Holland states she is on the treadmill for 15 minutes and bike riding for 20+ minutes 4-5 times per week.  Today's visit was #: 4 Starting weight: 217 lbs Starting date: 07/15/2020 Today's weight: 204 lbs Today's date: 09/22/2020 Total lbs lost to date: 13 Total lbs lost since last in-office visit: 3  Interim History: Kristen Holland continues to do well with weight loss. She eats eggs and liver pudding for breakfast. She eats meat and veggies for lunch. She is not a bread eater. She is not eating enough protein for dinner.  Subjective:   1. Diabetes mellitus type 2 in obese Kristen Holland is on metformin, and her last A1c was 6.6.  Assessment/Plan:   1. Diabetes mellitus type 2 in obese [facility]) Kristen Holland will continue metformin. Good blood sugar control is important to decrease the likelihood of diabetic complications such as nephropathy, neuropathy, limb loss, blindness, coronary artery disease, and death. Intensive lifestyle modification including diet, exercise and weight loss are the first line of treatment for diabetes.   2. Obesity, current BMI 33.95 Kristen Holland is currently in the action stage of change. As such, her goal is to continue with weight loss efforts. She has agreed to the Category 1 Plan.   Lunch options were given.  Exercise goals: As is.  Behavioral modification strategies: increasing lean protein intake and meal planning and cooking strategies.  Kristen Holland has agreed to follow-up with our clinic in 3 weeks. She was informed of the importance of frequent follow-up visits to maximize her success with intensive lifestyle modifications for her multiple health conditions.   Objective:   Blood pressure 94/62, pulse 87,  temperature 97.9 F (36.6 C), height 5\' 5"  (1.651 m), weight 204 lb (92.5 kg), SpO2 98 %. Body mass index is 33.95 kg/m.  General: Cooperative, alert, well developed, in no acute distress. HEENT: Conjunctivae and lids unremarkable. Cardiovascular: Regular rhythm.  Lungs: Normal work of breathing. Neurologic: No focal deficits.   Lab Results  Component Value Date   CREATININE 0.84 07/15/2020   BUN 7 07/15/2020   NA 142 07/15/2020   K 4.4 07/15/2020   CL 100 07/15/2020   CO2 26 07/15/2020   Lab Results  Component Value Date   ALT 19 07/15/2020   AST 16 07/15/2020   ALKPHOS 92 07/15/2020   BILITOT 0.3 07/15/2020   Lab Results  Component Value Date   HGBA1C 6.6 (H) 07/15/2020   HGBA1C 5.3 07/15/2012   Lab Results  Component Value Date   INSULIN 12.3 07/15/2020   Lab Results  Component Value Date   TSH 1.010 07/15/2020   Lab Results  Component Value Date   CHOL 175 07/15/2020   HDL 42 07/15/2020   LDLCALC 110 (H) 07/15/2020   TRIG 130 07/15/2020   Lab Results  Component Value Date   VD25OH 116.0 (H) 07/15/2020   Lab Results  Component Value Date   WBC 8.7 05/10/2020   HGB 12.5 05/10/2020   HCT 39.2 05/10/2020   MCV 88.5 05/10/2020   PLT 372 05/10/2020   No results found for: IRON, TIBC, FERRITIN  Attestation Statements:   Reviewed by clinician on day of visit: allergies, medications, problem list, medical history, surgical history, family history, social history,  and previous encounter notes.  Time spent on visit including pre-visit chart review and post-visit care and charting was 30 minutes.    Trude Mcburney, am acting as transcriptionist for [employer], PA-C.  I have reviewed the above documentation for accuracy and completeness, and I agree with the above. Alois Cliche, PA-C

## 2020-10-10 ENCOUNTER — Other Ambulatory Visit: Payer: BC Managed Care – PPO

## 2020-10-13 ENCOUNTER — Ambulatory Visit (INDEPENDENT_AMBULATORY_CARE_PROVIDER_SITE_OTHER): Payer: BC Managed Care – PPO | Admitting: Physician Assistant

## 2020-10-13 ENCOUNTER — Ambulatory Visit
Admission: RE | Admit: 2020-10-13 | Discharge: 2020-10-13 | Disposition: A | Discharge status: Home / Self Care | Payer: BC Managed Care – PPO | Source: Ambulatory Visit | Attending: Orthopedic Surgery | Admitting: Orthopedic Surgery

## 2020-10-13 DIAGNOSIS — M25512 Pain in left shoulder: Secondary | ICD-10-CM

## 2020-10-14 ENCOUNTER — Other Ambulatory Visit: Payer: Self-pay

## 2020-10-14 ENCOUNTER — Encounter (INDEPENDENT_AMBULATORY_CARE_PROVIDER_SITE_OTHER): Payer: Self-pay | Admitting: Bariatrics

## 2020-10-14 ENCOUNTER — Ambulatory Visit (INDEPENDENT_AMBULATORY_CARE_PROVIDER_SITE_OTHER): Payer: BC Managed Care – PPO | Admitting: Bariatrics

## 2020-10-14 VITALS — BP 104/69 | HR 70 | Temp 97.5°F | Ht 65.0 in | Wt 204.0 lb

## 2020-10-14 DIAGNOSIS — Z9189 Other specified personal risk factors, not elsewhere classified: Secondary | ICD-10-CM

## 2020-10-14 DIAGNOSIS — Z6836 Body mass index (BMI) 36.0-36.9, adult: Secondary | ICD-10-CM

## 2020-10-14 DIAGNOSIS — F5089 Other specified eating disorder: Secondary | ICD-10-CM | POA: Diagnosis not present

## 2020-10-14 DIAGNOSIS — I10 Essential (primary) hypertension: Secondary | ICD-10-CM | POA: Diagnosis not present

## 2020-10-14 DIAGNOSIS — E669 Obesity, unspecified: Secondary | ICD-10-CM

## 2020-10-14 DIAGNOSIS — E1169 Type 2 diabetes mellitus with other specified complication: Secondary | ICD-10-CM

## 2020-10-14 MED ORDER — BUPROPION HCL ER (SR) 150 MG PO TB12: 150.0000 mg | ORAL_TABLET | Freq: Every day | ORAL | 0 refills | Status: DC

## 2020-10-14 NOTE — Progress Notes (Signed)
Chief Complaint:   OBESITY Kristen Holland is here to discuss her progress with her obesity treatment plan along with follow-up of her obesity related diagnoses. Kristen Holland is on the Category 1 Plan and states she is following her eating plan approximately 20% of the time. Kristen Holland states she is on the treadmill and bike for 20 minutes 3 times per week.  Today's visit was #: 5 Starting weight: 217 lbs Starting date: 07/15/2020 Today's weight: 204 lbs Today's date: 10/14/2020 Total lbs lost to date: 13 Total lbs lost since last in-office visit: 0  Interim History: Kristen Holland's weight remains the same as her last visit. She is doing well with her water. She struggles with the protein.  Subjective:   1. Essential hypertension Kristen Holland is taking hydrochlorothiazide and Avapro, and her blood pressure is controlled.  2. Diabetes mellitus type 2 in obese (HCC) Kristen Holland is taking metformin.  3. Other disorder of eating Kristen Holland notes increased stress eating.  4. At risk for dehydration Kristen Holland is at risk for dehydration due to obesity and weather.  Assessment/Plan:   1. Essential hypertension Kristen Holland will continue her medications, and will continue working on healthy weight loss and exercise to improve blood pressure control. We will watch for signs of hypotension as she continues her lifestyle modifications.  2. Diabetes mellitus type 2 in obese Kristen Holland) Kristen Holland will continue metformin. Good blood sugar control is important to decrease the likelihood of diabetic complications such as nephropathy, neuropathy, limb loss, blindness, coronary artery disease, and death. Intensive lifestyle modification including diet, exercise and weight loss are the first line of treatment for diabetes.   3. Other disorder of eating Behavior modification techniques were discussed today to help Kristen Holland deal with her emotional/non-hunger eating behaviors. Kristen Holland agreed to start Wellbutrin SR 150 mg q daily with no refills. Orders and  follow up as documented in patient record.   - buPROPion (WELLBUTRIN SR) 150 MG 12 hr tablet; Take 1 tablet (150 mg total) by mouth daily.  Dispense: 30 tablet; Refill: 0  4. At risk for dehydration Kristen Holland was given approximately 15 minutes dehydration prevention counseling today. Kristen Holland is at risk for dehydration due to weight loss and current medication(s). She was encouraged to hydrate and monitor fluid status to avoid dehydration as well as weight loss plateaus.   5. Obesity, current BMI 34 Kristen Holland is currently in the action stage of change. As such, her goal is to continue with weight loss efforts. She has agreed to the Category 1 Plan.   Intentional eating was discussed.  Exercise goals: As is.  Behavioral modification strategies: increasing lean protein intake, decreasing simple carbohydrates, increasing vegetables, increasing water intake, decreasing eating out, no skipping meals, meal planning and cooking strategies, keeping healthy foods in the home, and planning for success.  Kristen Holland has agreed to follow-up with our clinic in 2 weeks. She was informed of the importance of frequent follow-up visits to maximize her success with intensive lifestyle modifications for her multiple health conditions.   Objective:   Blood pressure 104/69, pulse 70, temperature (!) 97.5 F (36.4 C), height 5\' 5"  (1.651 m), weight 204 lb (92.5 kg), SpO2 96 %. Body mass index is 33.95 kg/m.  General: Cooperative, alert, well developed, in no acute distress. HEENT: Conjunctivae and lids unremarkable. Cardiovascular: Regular rhythm.  Lungs: Normal work of breathing. Neurologic: No focal deficits.   Lab Results  Component Value Date   CREATININE 0.84 07/15/2020   BUN 7 07/15/2020   NA 142 07/15/2020  K 4.4 07/15/2020   CL 100 07/15/2020   CO2 26 07/15/2020   Lab Results  Component Value Date   ALT 19 07/15/2020   AST 16 07/15/2020   ALKPHOS 92 07/15/2020   BILITOT 0.3 07/15/2020   Lab  Results  Component Value Date   HGBA1C 6.6 (H) 07/15/2020   HGBA1C 5.3 07/15/2012   Lab Results  Component Value Date   INSULIN 12.3 07/15/2020   Lab Results  Component Value Date   TSH 1.010 07/15/2020   Lab Results  Component Value Date   CHOL 175 07/15/2020   HDL 42 07/15/2020   LDLCALC 110 (H) 07/15/2020   TRIG 130 07/15/2020   Lab Results  Component Value Date   VD25OH 116.0 (H) 07/15/2020   Lab Results  Component Value Date   WBC 8.7 05/10/2020   HGB 12.5 05/10/2020   HCT 39.2 05/10/2020   MCV 88.5 05/10/2020   PLT 372 05/10/2020   No results found for: IRON, TIBC, FERRITIN  Attestation Statements:   Reviewed by clinician on day of visit: allergies, medications, problem list, medical history, surgical history, family history, social history, and previous encounter notes.   Trude Mcburney, am acting as Energy manager for [employer], DO.  I have reviewed the above documentation for accuracy and completeness, and I agree with the above. Corinna Capra, DO

## 2020-10-15 ENCOUNTER — Encounter (INDEPENDENT_AMBULATORY_CARE_PROVIDER_SITE_OTHER): Payer: Self-pay | Admitting: Bariatrics

## 2020-10-21 ENCOUNTER — Ambulatory Visit (INDEPENDENT_AMBULATORY_CARE_PROVIDER_SITE_OTHER): Payer: BC Managed Care – PPO | Admitting: Physician Assistant

## 2020-10-29 ENCOUNTER — Ambulatory Visit (INDEPENDENT_AMBULATORY_CARE_PROVIDER_SITE_OTHER): Payer: BC Managed Care – PPO | Admitting: Bariatrics

## 2020-12-14 ENCOUNTER — Ambulatory Visit (INDEPENDENT_AMBULATORY_CARE_PROVIDER_SITE_OTHER): Payer: BC Managed Care – PPO | Admitting: Bariatrics

## 2020-12-14 ENCOUNTER — Encounter (INDEPENDENT_AMBULATORY_CARE_PROVIDER_SITE_OTHER): Payer: Self-pay | Admitting: Bariatrics

## 2020-12-14 ENCOUNTER — Other Ambulatory Visit: Payer: Self-pay

## 2020-12-14 VITALS — BP 97/63 | HR 75 | Temp 98.1°F | Ht 65.0 in | Wt 199.0 lb

## 2020-12-14 DIAGNOSIS — F3289 Other specified depressive episodes: Secondary | ICD-10-CM

## 2020-12-14 DIAGNOSIS — I10 Essential (primary) hypertension: Secondary | ICD-10-CM | POA: Diagnosis not present

## 2020-12-14 DIAGNOSIS — E7849 Other hyperlipidemia: Secondary | ICD-10-CM | POA: Diagnosis not present

## 2020-12-14 DIAGNOSIS — Z6836 Body mass index (BMI) 36.0-36.9, adult: Secondary | ICD-10-CM

## 2020-12-14 MED ORDER — BUPROPION HCL ER (SR) 150 MG PO TB12: 150.0000 mg | ORAL_TABLET | Freq: Every day | ORAL | 0 refills | Status: DC

## 2020-12-14 NOTE — Progress Notes (Signed)
Chief Complaint:   OBESITY Kristen Holland is here to discuss her progress with her obesity treatment plan along with follow-up of her obesity related diagnoses. Kristen Holland is on the Category 1 Plan and states she is following her eating plan approximately 10% of the time. Kristen Holland states she is using the treadmill and bike for 20 minutes 2 times per week.  Today's visit was #: 6 Starting weight: 217 lbs Starting date: 07/15/2020 Today's weight: 199 lbs Today's date: 12/14/2020 Total lbs lost to date: 18 lbs Total lbs lost since last in-office visit: 5 lbs  Interim History: Kristen Holland is down an additional 5 lbs, since her last visit. She is doing well with her water.  Subjective:   1. Other hyperlipidemia Kristen Holland is currently taking Lipitor.  2. Essential hypertension Kristen Holland is taking HCTZ and Avapro. She denies lightheadness. Her blood pressure was 97/63.  3. Other depression, with emotional eating Kristen Holland is struggling with emotional eating and using food for comfort to the extent that it is negatively impacting her health. She has been working on behavior modification techniques to help reduce her emotional eating. She shows no sign of suicidal or homicidal ideations.   Assessment/Plan:   1. Other hyperlipidemia Cardiovascular risk and specific lipid/LDL goals reviewed.  We discussed several lifestyle modifications today and Kristen Holland will continue to work on diet, exercise and weight loss efforts. Kristen Holland will continue medications. Orders and follow up as documented in patient record.   Counseling Intensive lifestyle modifications are the first line treatment for this issue. Dietary changes: Increase soluble fiber. Decrease simple carbohydrates. Exercise changes: Moderate to vigorous-intensity aerobic activity 150 minutes per week if tolerated. Lipid-lowering medications: see documented in medical record.   2. Essential hypertension Kristen Holland will take 1/2 of HCTZ and continue taking Avapro.  She is working on healthy weight loss and exercise to improve blood pressure control. We will watch for signs of hypotension as she continues her lifestyle modifications.   3. Other depression, with emotional eating Behavior modification techniques were discussed today to help Kristen Holland deal with her emotional/non-hunger eating behaviors.  Orders and follow up as documented in patient record.    - buPROPion (WELLBUTRIN SR) 150 MG 12 hr tablet; Take 1 tablet (150 mg total) by mouth daily.  Dispense: 30 tablet; Refill: 0  4. Obesity, current BMI 33.2 Kristen Holland is currently in the action stage of change. As such, her goal is to continue with weight loss efforts. She has agreed to the Category 1 Plan.   Kristen Holland will continue meal planning and intentional eating. She will decrease or eliminate her potatoes.  Exercise goals:  Kristen Holland will continue using the exercise equipment 2 times per week.  Behavioral modification strategies: increasing lean protein intake, decreasing simple carbohydrates, increasing vegetables, increasing water intake, decreasing eating out, no skipping meals, meal planning and cooking strategies, keeping healthy foods in the home, and planning for success.  Kristen Holland has agreed to follow-up with our clinic in 2-3 weeks with Alois Cliche, PAC or myself. She was informed of the importance of frequent follow-up visits to maximize her success with intensive lifestyle modifications for her multiple health conditions.   Objective:   Blood pressure 97/63, pulse 75, temperature 98.1 F (36.7 C), height 5\' 5"  (1.651 m), weight 199 lb (90.3 kg), SpO2 99 %. Body mass index is 33.12 kg/m.  General: Cooperative, alert, well developed, in no acute distress. HEENT: Conjunctivae and lids unremarkable. Cardiovascular: Regular rhythm.  Lungs: Normal work of breathing. Neurologic: No focal  deficits.   Lab Results  Component Value Date   CREATININE 0.84 07/15/2020   BUN 7 07/15/2020   NA 142  07/15/2020   K 4.4 07/15/2020   CL 100 07/15/2020   CO2 26 07/15/2020   Lab Results  Component Value Date   ALT 19 07/15/2020   AST 16 07/15/2020   ALKPHOS 92 07/15/2020   BILITOT 0.3 07/15/2020   Lab Results  Component Value Date   HGBA1C 6.6 (H) 07/15/2020   HGBA1C 5.3 07/15/2012   Lab Results  Component Value Date   INSULIN 12.3 07/15/2020   Lab Results  Component Value Date   TSH 1.010 07/15/2020   Lab Results  Component Value Date   CHOL 175 07/15/2020   HDL 42 07/15/2020   LDLCALC 110 (H) 07/15/2020   TRIG 130 07/15/2020   Lab Results  Component Value Date   VD25OH 116.0 (H) 07/15/2020   Lab Results  Component Value Date   WBC 8.7 05/10/2020   HGB 12.5 05/10/2020   HCT 39.2 05/10/2020   MCV 88.5 05/10/2020   PLT 372 05/10/2020   No results found for: IRON, TIBC, FERRITIN  Attestation Statements:   Reviewed by clinician on day of visit: allergies, medications, problem list, medical history, surgical history, family history, social history, and previous encounter notes.  I, Jackson Latino, RMA, am acting as Energy manager for [employer], DO.   I have reviewed the above documentation for accuracy and completeness, and I agree with the above. Corinna Capra, DO

## 2020-12-15 ENCOUNTER — Encounter (INDEPENDENT_AMBULATORY_CARE_PROVIDER_SITE_OTHER): Payer: Self-pay | Admitting: Bariatrics

## 2020-12-18 ENCOUNTER — Other Ambulatory Visit: Payer: Self-pay | Admitting: Orthopedic Surgery

## 2020-12-18 DIAGNOSIS — M533 Sacrococcygeal disorders, not elsewhere classified: Secondary | ICD-10-CM

## 2020-12-30 ENCOUNTER — Ambulatory Visit (INDEPENDENT_AMBULATORY_CARE_PROVIDER_SITE_OTHER): Payer: BC Managed Care – PPO | Admitting: Physician Assistant

## 2021-01-06 ENCOUNTER — Ambulatory Visit
Admission: RE | Admit: 2021-01-06 | Discharge: 2021-01-06 | Disposition: A | Discharge status: Home / Self Care | Payer: No Typology Code available for payment source | Source: Ambulatory Visit | Attending: Orthopedic Surgery | Admitting: Orthopedic Surgery

## 2021-01-06 ENCOUNTER — Other Ambulatory Visit: Payer: Self-pay

## 2021-01-06 DIAGNOSIS — M533 Sacrococcygeal disorders, not elsewhere classified: Secondary | ICD-10-CM

## 2021-02-21 ENCOUNTER — Ambulatory Visit
Admission: EM | Admit: 2021-02-21 | Discharge: 2021-02-21 | Disposition: A | Discharge status: Home / Self Care | Payer: BC Managed Care – PPO | Attending: Emergency Medicine | Admitting: Emergency Medicine

## 2021-02-21 ENCOUNTER — Other Ambulatory Visit: Payer: Self-pay

## 2021-02-21 ENCOUNTER — Encounter: Payer: Self-pay | Admitting: Emergency Medicine

## 2021-02-21 DIAGNOSIS — J45901 Unspecified asthma with (acute) exacerbation: Secondary | ICD-10-CM

## 2021-02-21 DIAGNOSIS — J069 Acute upper respiratory infection, unspecified: Secondary | ICD-10-CM | POA: Diagnosis not present

## 2021-02-21 LAB — POCT INFLUENZA A/B
Influenza A, POC: NEGATIVE
Influenza B, POC: NEGATIVE

## 2021-02-21 MED ORDER — PROMETHAZINE-DM 6.25-15 MG/5ML PO SYRP: 5.0000 mL | ORAL_SOLUTION | Freq: Every evening | ORAL | 0 refills | Status: DC | PRN

## 2021-02-21 MED ORDER — PREDNISONE 10 MG PO TABS: 40.0000 mg | ORAL_TABLET | Freq: Every day | ORAL | 0 refills | Status: AC

## 2021-02-21 NOTE — ED Provider Notes (Signed)
Kristen Holland    CSN: [id number] Arrival date & time: 02/21/21  0808      History   Chief Complaint Chief Complaint  Patient presents with   Chest Congestion   Cough   Nasal Congestion    HPI Kristen Holland is a 55 y.o. female.  Patient presents with ear pain, sore throat, nasal congestion, runny nose, cough x 4 days.  She also reports mild shortness of breath and occasional wheezing.  She has been using her albuterol inhaler.  No fever, rash, vomiting, diarrhea, or other symptoms.  She had a negative COVID test yesterday at [facility].  Her medical history includes asthma, hypertension, diabetes.  The history is provided by the patient and medical records.   Past Medical History:  Diagnosis Date   Abdominal pain    Allergy    Arthritis    Asthma    Back pain    Constipation    Depression    Diabetes mellitus without complication ([place])    Fatty liver    Gallbladder problem    GERD (gastroesophageal reflux disease)    Hyperlipidemia    Hypertension    Joint pain    Nausea & vomiting    Seasonal allergies    Sleep apnea    Vitamin D deficiency     Patient Active Problem List   Diagnosis Date Noted   Essential hypertension 05/13/2017   Diabetes mellitus type 2 in obese ([place]) 05/13/2017   Hypercholesterolemia 05/13/2017   Obesity with serious comorbidity 05/13/2017   Family history of CHF (congestive heart failure) 05/13/2017   Diabetes ([place]) 12/29/2011   RUQ pain 12/29/2011   Constipation 12/29/2011    Past Surgical History:  Procedure Laterality Date   ABDOMINAL HYSTERECTOMY     APPENDECTOMY     BACK SURGERY      OB History     Gravida  3   Para  2   Term      Preterm      AB      Living         SAB      IAB      Ectopic      Multiple      Live Births               Home Medications    Prior to Admission medications   Medication Sig Start Date End Date Taking? Authorizing Provider  predniSONE (DELTASONE) 10 MG tablet Take  4 tablets (40 mg total) by mouth daily for 5 days. 02/21/21 02/26/21 Yes Sharion Balloon, NP  promethazine-dextromethorphan (PROMETHAZINE-DM) 6.25-15 MG/5ML syrup Take 5 mLs by mouth at bedtime as needed for cough. 02/21/21  Yes Sharion Balloon, NP  albuterol (PROVENTIL HFA;VENTOLIN HFA) 108 (90 BASE) MCG/ACT inhaler Inhale 1-2 puffs into the lungs every 6 (six) hours as needed for wheezing or shortness of breath. Patient taking differently: Inhale 2 puffs into the lungs every 6 (six) hours as needed for wheezing or shortness of breath. 11/21/13   Harvie Heck, PA-C  atorvastatin (LIPITOR) 40 MG tablet Take 40 mg by mouth daily. 10/31/14   [provider]  buPROPion (WELLBUTRIN SR) 150 MG 12 hr tablet Take 1 tablet (150 mg total) by mouth daily. 12/14/20   Jearld Lesch A, DO  dexlansoprazole (DEXILANT) 60 MG capsule Take 60 mg by mouth daily.    [provider]  hydrochlorothiazide (HYDRODIURIL) 25 MG tablet TAKE 1 TABLET BY MOUTH EVERY DAY 02/16/18  Croitoru, Mihai, MD  irbesartan (AVAPRO) 300 MG tablet Take 300 mg by mouth daily.    [provider]  metFORMIN (GLUCOPHAGE-XR) 500 MG 24 hr tablet Take 500 mg by mouth daily. 03/03/16   [provider]  montelukast (SINGULAIR) 10 MG tablet Take 10 mg by mouth every other day.     [provider]    Family History Family History  Problem Relation Age of Onset   Hypertension Mother    Diabetes Mother    Cancer Mother    Depression Mother    Alcoholism Mother    Drug abuse Mother    Cancer Father    Diabetes Father    Hypertension Father    Kidney disease Father    Liver disease Father    Alcoholism Father    Heart murmur Sister    Heart failure Child     Social History Social History   Tobacco Use   Smoking status: Never   Smokeless tobacco: Never  Vaping Use   Vaping Use: Never used  Substance Use Topics   Alcohol use: No   Drug use: No     Allergies   Patient has no known  allergies.   Review of Systems Review of Systems  Constitutional:  Negative for chills and fever.  HENT:  Positive for congestion, ear pain, rhinorrhea and sore throat.   Respiratory:  Positive for cough, shortness of breath and wheezing.   Cardiovascular:  Negative for chest pain and palpitations.  Gastrointestinal:  Negative for diarrhea and vomiting.  Skin:  Negative for color change and rash.  All other systems reviewed and are negative.   Physical Exam Triage Vital Signs ED Triage Vitals  Enc Vitals Group     BP      Pulse      Resp      Temp      Temp src      SpO2      Weight      Height      Head Circumference      Peak Flow      Pain Score      Pain Loc      Pain Edu?      Excl. in [place]?    No data found.  Updated Vital Signs BP 113/78 (BP Location: Left Arm)    Pulse 88    Temp 98 F (36.7 C) (Oral)    Resp 18    SpO2 95%   Visual Acuity Right Eye Distance:   Left Eye Distance:   Bilateral Distance:    Right Eye Near:   Left Eye Near:    Bilateral Near:     Physical Exam Vitals and nursing note reviewed.  Constitutional:      General: She is not in acute distress.    Appearance: She is well-developed.  HENT:     Right Ear: Tympanic membrane normal.     Left Ear: Tympanic membrane normal.     Nose: Congestion and rhinorrhea present.     Mouth/Throat:     Mouth: Mucous membranes are moist.     Pharynx: Oropharynx is clear.  Cardiovascular:     Rate and Rhythm: Normal rate and regular rhythm.     Heart sounds: Normal heart sounds.  Pulmonary:     Effort: Pulmonary effort is normal. No respiratory distress.     Breath sounds: Normal breath sounds. No wheezing.  Abdominal:     Palpations: Abdomen is soft.  Tenderness: There is no abdominal tenderness.  Musculoskeletal:     Cervical back: Neck supple.  Skin:    General: Skin is warm and dry.  Neurological:     Mental Status: She is alert.  Psychiatric:        Mood and Affect: Mood  normal.        Behavior: Behavior normal.     UC Treatments / Results  Labs (all labs ordered are listed, but only abnormal results are displayed) Labs Reviewed  POCT INFLUENZA A/B    EKG   Radiology No results found.  Procedures Procedures (including critical care time)  Medications Ordered in UC Medications - No data to display  Initial Impression / Assessment and Plan / UC Course  I have reviewed the triage vital signs and the nursing notes.  Pertinent labs & imaging results that were available during my care of the patient were reviewed by me and considered in my medical decision making (see chart for details).   Viral URI with cough, Asthma exacerbation.  No respiratory distress, no wheezing at this time, O2 sat 95% on room air.  Rapid flu negative.  Patient had a negative COVID test yesterday.  Treating with prednisone and continued use of albuterol.  Treating cough with Promethazine DM; precautions for drowsiness discussed.  Instructed patient to follow-up with her PCP if her symptoms are not improving.  She agrees to plan of care.   Final Clinical Impressions(s) / UC Diagnoses   Final diagnoses:  Viral URI with cough  Asthma with acute exacerbation, unspecified asthma severity, unspecified whether persistent     Discharge Instructions      Continue to use your albuterol inhaler.  Take the prednisone as directed.    Take the Promethazine DM cough syrup as directed.  Do not drive, operate machinery, or drink alcohol with this medication as it may cause drowsiness.    Follow up with your primary care provider if your symptoms are not improving.         ED Prescriptions     Medication Sig Dispense Auth. Provider   predniSONE (DELTASONE) 10 MG tablet Take 4 tablets (40 mg total) by mouth daily for 5 days. 20 tablet Mickie Bail, NP   promethazine-dextromethorphan (PROMETHAZINE-DM) 6.25-15 MG/5ML syrup Take 5 mLs by mouth at bedtime as needed for cough. 30  mL Mickie Bail, NP      PDMP not reviewed this encounter.   Mickie Bail, NP 02/21/21 [phone]

## 2021-02-21 NOTE — ED Triage Notes (Signed)
Pt here with URI sx x 3 days. Chest congestion is main concern as she states she is prone to pneumonia.

## 2021-02-21 NOTE — Discharge Instructions (Addendum)
Continue to use your albuterol inhaler.  Take the prednisone as directed.    Take the Promethazine DM cough syrup as directed.  Do not drive, operate machinery, or drink alcohol with this medication as it may cause drowsiness.    Follow up with your primary care provider if your symptoms are not improving.

## 2021-04-04 ENCOUNTER — Other Ambulatory Visit: Payer: Self-pay

## 2021-04-04 ENCOUNTER — Ambulatory Visit
Admission: EM | Admit: 2021-04-04 | Discharge: 2021-04-04 | Disposition: A | Discharge status: Home / Self Care | Payer: BC Managed Care – PPO | Attending: Emergency Medicine | Admitting: Emergency Medicine

## 2021-04-04 ENCOUNTER — Encounter: Payer: Self-pay | Admitting: Emergency Medicine

## 2021-04-04 DIAGNOSIS — S0502XA Injury of conjunctiva and corneal abrasion without foreign body, left eye, initial encounter: Secondary | ICD-10-CM | POA: Diagnosis not present

## 2021-04-04 DIAGNOSIS — H1132 Conjunctival hemorrhage, left eye: Secondary | ICD-10-CM | POA: Diagnosis not present

## 2021-04-04 MED ORDER — POLYMYXIN B-TRIMETHOPRIM 10000-0.1 UNIT/ML-% OP SOLN: 1.0000 [drp] | Freq: Four times a day (QID) | OPHTHALMIC | 0 refills | Status: AC

## 2021-04-04 NOTE — ED Provider Notes (Signed)
[name]    CSN: [id number] Arrival date & time: 04/04/21  1517      History   Chief Complaint Chief Complaint  Patient presents with   Eye Problem    HPI Kristen Holland is a 56 y.o. female.  Patient presents with left eye redness and irritation since the evening of 04/02/2021.  No trauma.  No eye pain or changes in vision.  She states she felt like she had a piece of sand in her eye at the onset of her symptoms.  No eye drainage, fever, or other symptoms. No treatment at home. Patient normally wears contact lenses but is wearing glasses today.  Her medical history includes diabetes, hypertension, asthma.  The history is provided by the patient and medical records.   Past Medical History:  Diagnosis Date   Abdominal pain    Allergy    Arthritis    Asthma    Back pain    Constipation    Depression    Diabetes mellitus without complication ([place])    Fatty liver    Gallbladder problem    GERD (gastroesophageal reflux disease)    Hyperlipidemia    Hypertension    Joint pain    Nausea & vomiting    Seasonal allergies    Sleep apnea    Vitamin D deficiency     Patient Active Problem List   Diagnosis Date Noted   Essential hypertension 05/13/2017   Diabetes mellitus type 2 in obese ([place]) 05/13/2017   Hypercholesterolemia 05/13/2017   Obesity with serious comorbidity 05/13/2017   Family history of CHF (congestive heart failure) 05/13/2017   Diabetes ([place]) 12/29/2011   RUQ pain 12/29/2011   Constipation 12/29/2011    Past Surgical History:  Procedure Laterality Date   ABDOMINAL HYSTERECTOMY     APPENDECTOMY     BACK SURGERY      OB History     Gravida  3   Para  2   Term      Preterm      AB      Living         SAB      IAB      Ectopic      Multiple      Live Births               Home Medications    Prior to Admission medications   Medication Sig Start Date End Date Taking? Authorizing Provider  trimethoprim-polymyxin b  (POLYTRIM) ophthalmic solution Place 1 drop into the left eye 4 (four) times daily for 7 days. 04/04/21 04/11/21 Yes Sharion Balloon, NP  albuterol (PROVENTIL HFA;VENTOLIN HFA) 108 (90 BASE) MCG/ACT inhaler Inhale 1-2 puffs into the lungs every 6 (six) hours as needed for wheezing or shortness of breath. Patient taking differently: Inhale 2 puffs into the lungs every 6 (six) hours as needed for wheezing or shortness of breath. 11/21/13   Harvie Heck, PA-C  atorvastatin (LIPITOR) 40 MG tablet Take 40 mg by mouth daily. 10/31/14   [provider]  buPROPion (WELLBUTRIN SR) 150 MG 12 hr tablet Take 1 tablet (150 mg total) by mouth daily. 12/14/20   Jearld Lesch A, DO  dexlansoprazole (DEXILANT) 60 MG capsule Take 60 mg by mouth daily.    [provider]  hydrochlorothiazide (HYDRODIURIL) 25 MG tablet TAKE 1 TABLET BY MOUTH EVERY DAY 02/16/18   Croitoru, Mihai, MD  irbesartan (AVAPRO) 300 MG tablet Take 300 mg by mouth  daily.    [provider]  metFORMIN (GLUCOPHAGE-XR) 500 MG 24 hr tablet Take 500 mg by mouth daily. 03/03/16   [provider]  montelukast (SINGULAIR) 10 MG tablet Take 10 mg by mouth every other day.     [provider]  promethazine-dextromethorphan (PROMETHAZINE-DM) 6.25-15 MG/5ML syrup Take 5 mLs by mouth at bedtime as needed for cough. 02/21/21   Sharion Balloon, NP    Family History Family History  Problem Relation Age of Onset   Hypertension Mother    Diabetes Mother    Cancer Mother    Depression Mother    Alcoholism Mother    Drug abuse Mother    Cancer Father    Diabetes Father    Hypertension Father    Kidney disease Father    Liver disease Father    Alcoholism Father    Heart murmur Sister    Heart failure Child     Social History Social History   Tobacco Use   Smoking status: Never   Smokeless tobacco: Never  Vaping Use   Vaping Use: Never used  Substance Use Topics   Alcohol use: No   Drug use: No      Allergies   Patient has no known allergies.   Review of Systems Review of Systems  Constitutional:  Negative for chills and fever.  Eyes:  Positive for redness. Negative for pain, discharge, itching and visual disturbance.  Skin:  Negative for color change and rash.  All other systems reviewed and are negative.   Physical Exam Triage Vital Signs ED Triage Vitals  Enc Vitals Group     BP      Pulse      Resp      Temp      Temp src      SpO2      Weight      Height      Head Circumference      Peak Flow      Pain Score      Pain Loc      Pain Edu?      Excl. in [place]?    No data found.  Updated Vital Signs BP 135/88 (BP Location: Left Arm)    Pulse 95    Temp 98.4 F (36.9 C)    Resp 18    SpO2 95%   Visual Acuity Right Eye Distance:   Left Eye Distance:   Bilateral Distance:    Right Eye Near:   Left Eye Near:    Bilateral Near:     Physical Exam Vitals and nursing note reviewed.  Constitutional:      General: She is not in acute distress.    Appearance: She is well-developed.  HENT:     Head: Normocephalic and atraumatic.     Mouth/Throat:     Mouth: Mucous membranes are moist.  Eyes:     General: Lids are normal. Vision grossly intact.        Right eye: No discharge.        Left eye: No discharge.     Extraocular Movements: Extraocular movements intact.     Conjunctiva/sclera:     Left eye: Left conjunctiva is injected.     Pupils: Pupils are equal, round, and reactive to light.   Cardiovascular:     Rate and Rhythm: Normal rate and regular rhythm.     Heart sounds: Normal heart sounds.  Pulmonary:     Effort: Pulmonary  effort is normal. No respiratory distress.     Breath sounds: Normal breath sounds.  Musculoskeletal:     Cervical back: Neck supple.  Skin:    General: Skin is warm and dry.  Neurological:     Mental Status: She is alert.  Psychiatric:        Mood and Affect: Mood normal.        Behavior: Behavior normal.     UC  Treatments / Results  Labs (all labs ordered are listed, but only abnormal results are displayed) Labs Reviewed - No data to display  EKG   Radiology No results found.  Procedures Procedures (including critical care time)  Medications Ordered in UC Medications - No data to display  Initial Impression / Assessment and Plan / UC Course  I have reviewed the triage vital signs and the nursing notes.  Pertinent labs & imaging results that were available during my care of the patient were reviewed by me and considered in my medical decision making (see chart for details).    Left eye corneal abrasion and subconjunctival hemorrhage. No trauma. No eye pain, eye drainage, or vision changes.  Treating with Polytrim eye drops.  Instructed patient to follow up with her eye care provider tomorrow.  ED precautions discussed.  She agrees to plan of care.   Final Clinical Impressions(s) / UC Diagnoses   Final diagnoses:  Abrasion of left cornea, initial encounter  Subconjunctival hematoma, left     Discharge Instructions      Use the antibiotic eyedrops as prescribed.    Follow-up with your eye doctor for a recheck in 1 to 2 days.    Go to the emergency department if you have acute eye pain, changes in your vision, or other concerning symptoms.        ED Prescriptions     Medication Sig Dispense Auth. Provider   trimethoprim-polymyxin b (POLYTRIM) ophthalmic solution Place 1 drop into the left eye 4 (four) times daily for 7 days. 10 mL Sharion Balloon, NP      PDMP not reviewed this encounter.   Sharion Balloon, NP 04/04/21 1549

## 2021-04-04 NOTE — Discharge Instructions (Addendum)
Use the antibiotic eyedrops as prescribed.    Follow-up with your eye doctor for a recheck in 1 to 2 days.    Go to the emergency department if you have acute eye pain, changes in your vision, or other concerning symptoms.    

## 2021-04-04 NOTE — ED Triage Notes (Signed)
Pt here with left eye irritation since Friday with redness, but no vision changes.

## 2021-05-04 ENCOUNTER — Ambulatory Visit (INDEPENDENT_AMBULATORY_CARE_PROVIDER_SITE_OTHER): Payer: BC Managed Care – PPO | Admitting: Bariatrics

## 2021-05-04 ENCOUNTER — Other Ambulatory Visit: Payer: Self-pay

## 2021-05-04 ENCOUNTER — Encounter (INDEPENDENT_AMBULATORY_CARE_PROVIDER_SITE_OTHER): Payer: Self-pay | Admitting: Bariatrics

## 2021-05-04 ENCOUNTER — Other Ambulatory Visit (INDEPENDENT_AMBULATORY_CARE_PROVIDER_SITE_OTHER): Payer: Self-pay | Admitting: Bariatrics

## 2021-05-04 VITALS — BP 128/82 | HR 85 | Temp 97.9°F | Ht 65.0 in | Wt 214.0 lb

## 2021-05-04 DIAGNOSIS — E66812 Obesity, class 2: Secondary | ICD-10-CM

## 2021-05-04 DIAGNOSIS — F3289 Other specified depressive episodes: Secondary | ICD-10-CM

## 2021-05-04 DIAGNOSIS — E669 Obesity, unspecified: Secondary | ICD-10-CM | POA: Diagnosis not present

## 2021-05-04 DIAGNOSIS — Z6835 Body mass index (BMI) 35.0-35.9, adult: Secondary | ICD-10-CM | POA: Diagnosis not present

## 2021-05-04 DIAGNOSIS — E1169 Type 2 diabetes mellitus with other specified complication: Secondary | ICD-10-CM

## 2021-05-04 DIAGNOSIS — Z7984 Long term (current) use of oral hypoglycemic drugs: Secondary | ICD-10-CM

## 2021-05-04 DIAGNOSIS — E119 Type 2 diabetes mellitus without complications: Secondary | ICD-10-CM

## 2021-05-04 MED ORDER — TIRZEPATIDE 2.5 MG/0.5ML ~~LOC~~ SOAJ: 2.5000 mg | SUBCUTANEOUS | 0 refills | Status: DC

## 2021-05-04 MED ORDER — BUPROPION HCL ER (SR) 150 MG PO TB12: 150.0000 mg | ORAL_TABLET | Freq: Every day | ORAL | 0 refills | Status: DC

## 2021-05-04 NOTE — Progress Notes (Signed)
Chief Complaint:   OBESITY Kristen Holland is here to discuss her progress with her obesity treatment plan along with follow-up of her obesity related diagnoses. Kristen Holland is on the Category 1 Plan and states she is following her eating plan approximately 0% of the time. Kristen Holland states she is doing 0 minutes 0 times per week.  Today's visit was #: 7 Starting weight: 217 lbs Starting date: 07/15/2020 Today's weight: 214 lbs Today's date: 05/04/2021 Total lbs lost to date: 3 lbs Total lbs lost since last in-office visit: 0  Interim History: Kristen Holland is up 15 lbs since her last visit on 12/14/2020.  Subjective:   1. Diabetes mellitus type 2 in obese [facility]) Kristen Holland is taking Metformin currently. She denies contraindications.   2. Other depression, with emotional eating Kristen Holland notes emotional eating.   Assessment/Plan:   1. Diabetes mellitus type 2 in obese [facility]) Kristen Holland will continue taking Metformin. We will refill Mounjaro 2.5 mg for 1 month with no refills. Needs pre-authorization. She will decrease starches and sweets. Good blood sugar control is important to decrease the likelihood of diabetic complications such as nephropathy, neuropathy, limb loss, blindness, coronary artery disease, and death. Intensive lifestyle modification including diet, exercise and weight loss are the first line of treatment for diabetes.   - tirzepatide [facility]) 2.5 MG/0.5ML Pen; Inject 2.5 mg into the skin once a week.  Dispense: 2 mL; Refill: 0  2. Other depression, with emotional eating We will refill Wellbutrin 150 mg for 1 month with no refills. Behavior modification techniques were discussed today to help Kristen Holland deal with her emotional/non-hunger eating behaviors.  Orders and follow up as documented in patient record.   - buPROPion (WELLBUTRIN SR) 150 MG 12 hr tablet; Take 1 tablet (150 mg total) by mouth daily.  Dispense: 30 tablet; Refill: 0  3. Obesity, current BMI 35.6 Kristen Holland is currently in the action  stage of change. As such, her goal is to continue with weight loss efforts. She has agreed to the Category 1 Plan.   Kristen Holland will continue meal planning and she will resume plan 1.  Exercise goals: No exercise has been prescribed at this time.  Behavioral modification strategies: increasing lean protein intake, decreasing simple carbohydrates, increasing vegetables, increasing water intake, decreasing eating out, no skipping meals, meal planning and cooking strategies, keeping healthy foods in the home, and planning for success.  Kristen Holland has agreed to follow-up with our clinic in 2 weeks (fasting). She was informed of the importance of frequent follow-up visits to maximize her success with intensive lifestyle modifications for her multiple health conditions.   Objective:   Blood pressure 128/82, pulse 85, temperature 97.9 F (36.6 C), height 5\' 5"  (1.651 m), weight 214 lb (97.1 kg), SpO2 99 %. Body mass index is 35.61 kg/m.  General: Cooperative, alert, well developed, in no acute distress. HEENT: Conjunctivae and lids unremarkable. Cardiovascular: Regular rhythm.  Lungs: Normal work of breathing. Neurologic: No focal deficits.   Lab Results  Component Value Date   CREATININE 0.84 07/15/2020   BUN 7 07/15/2020   NA 142 07/15/2020   K 4.4 07/15/2020   CL 100 07/15/2020   CO2 26 07/15/2020   Lab Results  Component Value Date   ALT 19 07/15/2020   AST 16 07/15/2020   ALKPHOS 92 07/15/2020   BILITOT 0.3 07/15/2020   Lab Results  Component Value Date   HGBA1C 6.6 (H) 07/15/2020   HGBA1C 5.3 07/15/2012   Lab Results  Component Value Date  INSULIN 12.3 07/15/2020   Lab Results  Component Value Date   TSH 1.010 07/15/2020   Lab Results  Component Value Date   CHOL 175 07/15/2020   HDL 42 07/15/2020   LDLCALC 110 (H) 07/15/2020   TRIG 130 07/15/2020   Lab Results  Component Value Date   VD25OH 116.0 (H) 07/15/2020   Lab Results  Component Value Date   WBC 8.7  05/10/2020   HGB 12.5 05/10/2020   HCT 39.2 05/10/2020   MCV 88.5 05/10/2020   PLT 372 05/10/2020   No results found for: IRON, TIBC, FERRITIN  Attestation Statements:   Reviewed by clinician on day of visit: allergies, medications, problem list, medical history, surgical history, family history, social history, and previous encounter notes.  I, Jackson Latino, RMA, am acting as Energy manager for [employer], DO.  I have reviewed the above documentation for accuracy and completeness, and I agree with the above. Corinna Capra, DO

## 2021-05-04 NOTE — Telephone Encounter (Signed)
Pt last seen by Dr. Brown.  

## 2021-05-05 ENCOUNTER — Encounter (INDEPENDENT_AMBULATORY_CARE_PROVIDER_SITE_OTHER): Payer: Self-pay | Admitting: Bariatrics

## 2021-06-01 ENCOUNTER — Ambulatory Visit (INDEPENDENT_AMBULATORY_CARE_PROVIDER_SITE_OTHER): Payer: BC Managed Care – PPO | Admitting: Bariatrics

## 2021-06-03 ENCOUNTER — Other Ambulatory Visit (HOSPITAL_COMMUNITY): Payer: Self-pay | Admitting: Gastroenterology

## 2021-06-03 DIAGNOSIS — R1011 Right upper quadrant pain: Secondary | ICD-10-CM

## 2021-06-18 ENCOUNTER — Ambulatory Visit (HOSPITAL_COMMUNITY)
Admission: RE | Admit: 2021-06-18 | Discharge: 2021-06-18 | Disposition: A | Discharge status: Home / Self Care | Payer: BC Managed Care – PPO | Source: Ambulatory Visit | Attending: Gastroenterology | Admitting: Gastroenterology

## 2021-06-18 DIAGNOSIS — R1011 Right upper quadrant pain: Secondary | ICD-10-CM | POA: Diagnosis present

## 2021-06-18 MED ORDER — TECHNETIUM TC 99M MEBROFENIN IV KIT
5.0000 | PACK | Freq: Once | INTRAVENOUS | Status: AC | PRN
Start: 2021-06-18 — End: 2021-06-18
  Administered 2021-06-18: 5 via INTRAVENOUS

## 2021-06-24 ENCOUNTER — Other Ambulatory Visit: Payer: Self-pay | Admitting: Gastroenterology

## 2021-06-24 ENCOUNTER — Other Ambulatory Visit (HOSPITAL_COMMUNITY): Payer: Self-pay | Admitting: Gastroenterology

## 2021-06-24 DIAGNOSIS — R1011 Right upper quadrant pain: Secondary | ICD-10-CM

## 2021-06-24 DIAGNOSIS — R1033 Periumbilical pain: Secondary | ICD-10-CM

## 2021-06-25 ENCOUNTER — Ambulatory Visit
Admission: RE | Admit: 2021-06-25 | Discharge: 2021-06-25 | Disposition: A | Discharge status: Home / Self Care | Payer: BC Managed Care – PPO | Source: Ambulatory Visit | Attending: Gastroenterology | Admitting: Gastroenterology

## 2021-06-25 DIAGNOSIS — R1011 Right upper quadrant pain: Secondary | ICD-10-CM | POA: Insufficient documentation

## 2021-06-25 DIAGNOSIS — R1033 Periumbilical pain: Secondary | ICD-10-CM | POA: Insufficient documentation

## 2021-06-25 LAB — POCT I-STAT CREATININE: Creatinine, Ser: 1 mg/dL (ref 0.44–1.00)

## 2021-06-25 MED ORDER — IOHEXOL 300 MG/ML  SOLN
100.0000 mL | Freq: Once | INTRAMUSCULAR | Status: AC | PRN
  Administered 2021-06-25: 100 mL via INTRAVENOUS

## 2021-06-30 ENCOUNTER — Encounter (INDEPENDENT_AMBULATORY_CARE_PROVIDER_SITE_OTHER): Payer: Self-pay | Admitting: Bariatrics

## 2021-06-30 ENCOUNTER — Ambulatory Visit (INDEPENDENT_AMBULATORY_CARE_PROVIDER_SITE_OTHER): Payer: BC Managed Care – PPO | Admitting: Bariatrics

## 2021-06-30 VITALS — BP 113/75 | HR 84 | Temp 97.8°F | Ht 65.0 in | Wt 216.0 lb

## 2021-06-30 DIAGNOSIS — E1169 Type 2 diabetes mellitus with other specified complication: Secondary | ICD-10-CM | POA: Diagnosis not present

## 2021-06-30 DIAGNOSIS — E7849 Other hyperlipidemia: Secondary | ICD-10-CM | POA: Diagnosis not present

## 2021-06-30 DIAGNOSIS — Z6836 Body mass index (BMI) 36.0-36.9, adult: Secondary | ICD-10-CM

## 2021-06-30 DIAGNOSIS — Z7984 Long term (current) use of oral hypoglycemic drugs: Secondary | ICD-10-CM

## 2021-06-30 DIAGNOSIS — E669 Obesity, unspecified: Secondary | ICD-10-CM | POA: Diagnosis not present

## 2021-06-30 DIAGNOSIS — K5909 Other constipation: Secondary | ICD-10-CM | POA: Diagnosis not present

## 2021-06-30 MED ORDER — TIRZEPATIDE 5 MG/0.5ML ~~LOC~~ SOAJ: 5.0000 mg | SUBCUTANEOUS | 0 refills | Status: DC

## 2021-07-08 NOTE — Progress Notes (Signed)
? ? ? ?Chief Complaint:  ? ?OBESITY ?Kristen Holland is here to discuss her progress with her obesity treatment plan along with follow-up of her obesity related diagnoses. Kristen Holland is on the Category 1 Plan and states she is following her eating plan approximately 0% of the time. Kristen Holland states she is currently not exercising. ? ?Today's visit was #: 8 ?Starting weight: 217 lbs ?Starting date: 07/15/2021 ?Today's weight: 216 lbs ?Today's date: 06/30/2021 ?Total lbs lost to date: 1 lb ?Total lbs lost since last in-office visit: 0 lbs ? ?Interim History: Kristen Holland is up 2 lbs since her last visit. She is up almost up 1 lb of water per biompedance scale. ? ?Subjective:  ? ?1. Diabetes mellitus type 2 in obese Kristen Holland) ?Kristen Holland is currently taking Metformin. She had taken Trulicity.  ? ?2. Other hyperlipidemia ?Kristen Holland  is taking Lipitor. ? ?3. Other constipation ?Kristen Holland reports it is worse with the plan. ? ? ?Assessment/Plan:  ? ?1. Diabetes mellitus type 2 in obese Kristen Holland) ?Kristen Holland will stop taking Trulicity. Will start Monjuaro 5 mg injected into the skin weekly. ?- tirzepatide (MOUNJARO) 5 MG/0.5ML Pen; Inject 5 mg into the skin once a week.  Dispense: 2 mL; Refill: 0 ? ?2. Other hyperlipidemia ?Kristen Holland will continue taking Lipitor. ? ?3. Other constipation ?Kristen Holland will increase her water intake and metamucil.  She is taking Kristen Holland. ?4. Obesity, current BMI 36 ?Kristen Holland is currently in the action stage of change. As such, her goal is to continue with weight loss efforts. She has agreed to the Category 1 Plan.  ? ?Kristen Holland has agreed to meal planning and will adhere to the plan. ? ?Exercise goals: As is. ? ?Behavioral modification strategies: increasing lean protein intake, decreasing simple carbohydrates, increasing vegetables, increasing water intake, decreasing eating out, no skipping meals, meal planning and cooking strategies, keeping healthy foods in the home, and planning for success. ? ?Kristen Holland has agreed to follow-up with our clinic in  6 weeks. She was informed of the importance of frequent follow-up visits to maximize her success with intensive lifestyle modifications for her multiple health conditions.  ? ?Objective:  ? ?Blood pressure 113/75, pulse 84, temperature 97.8 ?F (36.6 ?C), height 5\' 5"  (1.651 m), weight 216 lb (98 kg), SpO2 95 %. ?Body mass index is 35.94 kg/m?. ? ?General: Cooperative, alert, well developed, in no acute distress. ?HEENT: Conjunctivae and lids unremarkable. ?Cardiovascular: Regular rhythm.  ?Lungs: Normal work of breathing. ?Neurologic: No focal deficits.  ? ?Lab Results  ?Component Value Date  ? CREATININE 1.00 06/25/2021  ? BUN 7 07/15/2020  ? NA 142 07/15/2020  ? K 4.4 07/15/2020  ? CL 100 07/15/2020  ? CO2 26 07/15/2020  ? ?Lab Results  ?Component Value Date  ? ALT 19 07/15/2020  ? AST 16 07/15/2020  ? ALKPHOS 92 07/15/2020  ? BILITOT 0.3 07/15/2020  ? ?Lab Results  ?Component Value Date  ? HGBA1C 6.6 (H) 07/15/2020  ? HGBA1C 5.3 07/15/2012  ? ?Lab Results  ?Component Value Date  ? INSULIN 12.3 07/15/2020  ? ?Lab Results  ?Component Value Date  ? TSH 1.010 07/15/2020  ? ?Lab Results  ?Component Value Date  ? CHOL 175 07/15/2020  ? HDL 42 07/15/2020  ? LDLCALC 110 (H) 07/15/2020  ? TRIG 130 07/15/2020  ? ?Lab Results  ?Component Value Date  ? VD25OH 116.0 (H) 07/15/2020  ? ?Lab Results  ?Component Value Date  ? WBC 8.7 05/10/2020  ? HGB 12.5 05/10/2020  ? HCT 39.2 05/10/2020  ? MCV  88.5 05/10/2020  ? PLT 372 05/10/2020  ? ?No results found for: IRON, TIBC, FERRITIN ? ?Attestation Statements:  ? ?Reviewed by clinician on day of visit: allergies, medications, problem list, medical history, surgical history, family history, social history, and previous encounter notes. ? ?I, Lennette Bihari, am acting as Location manager for [employer], DO. ? ?I have reviewed the above documentation for accuracy and completeness, and I agree with the above. Jearld Lesch, DO ? ?

## 2021-07-10 ENCOUNTER — Encounter (INDEPENDENT_AMBULATORY_CARE_PROVIDER_SITE_OTHER): Payer: Self-pay | Admitting: Bariatrics

## 2021-07-15 ENCOUNTER — Encounter (INDEPENDENT_AMBULATORY_CARE_PROVIDER_SITE_OTHER): Payer: Self-pay | Admitting: Bariatrics

## 2021-07-15 NOTE — Telephone Encounter (Signed)
Prior authorization started for Mounjaro. Will notify patient and provider once response is received.  ?

## 2021-07-15 NOTE — Telephone Encounter (Signed)
Please check PA, Thanks

## 2021-07-19 ENCOUNTER — Telehealth (INDEPENDENT_AMBULATORY_CARE_PROVIDER_SITE_OTHER): Payer: Self-pay | Admitting: Bariatrics

## 2021-07-19 ENCOUNTER — Encounter (INDEPENDENT_AMBULATORY_CARE_PROVIDER_SITE_OTHER): Payer: Self-pay

## 2021-07-19 NOTE — Telephone Encounter (Signed)
Dr. Manson Passey- Prior authorization denied for [facility]. Per insurance: -The member is unable to take the required number of formulary alternatives for the ?given diagnosis due to an intolerance or contraindication OR ?-The member has tried and failed the required number of formulary alternatives.  ?Formulary alternatives are: Ozempic, Rybelsus, Trulicity, Victoza. (Requirement: 3 in a ?class with 3 or more alternatives, 2 in a class with 2 alternatives, or 1 in a class with ?only 1 alternative. ?Patient sent denial message via mychart.  ?

## 2021-07-22 ENCOUNTER — Encounter (INDEPENDENT_AMBULATORY_CARE_PROVIDER_SITE_OTHER): Payer: Self-pay | Admitting: Bariatrics

## 2021-08-16 ENCOUNTER — Ambulatory Visit (INDEPENDENT_AMBULATORY_CARE_PROVIDER_SITE_OTHER): Payer: BC Managed Care – PPO | Admitting: Bariatrics

## 2021-08-16 ENCOUNTER — Encounter (INDEPENDENT_AMBULATORY_CARE_PROVIDER_SITE_OTHER): Payer: Self-pay | Admitting: Bariatrics

## 2021-08-16 ENCOUNTER — Other Ambulatory Visit (INDEPENDENT_AMBULATORY_CARE_PROVIDER_SITE_OTHER): Payer: Self-pay | Admitting: Bariatrics

## 2021-08-16 VITALS — BP 106/62 | HR 74 | Temp 98.0°F | Ht 65.0 in | Wt 219.0 lb

## 2021-08-16 DIAGNOSIS — E1169 Type 2 diabetes mellitus with other specified complication: Secondary | ICD-10-CM | POA: Diagnosis not present

## 2021-08-16 DIAGNOSIS — E669 Obesity, unspecified: Secondary | ICD-10-CM

## 2021-08-16 DIAGNOSIS — F3289 Other specified depressive episodes: Secondary | ICD-10-CM | POA: Diagnosis not present

## 2021-08-16 DIAGNOSIS — E559 Vitamin D deficiency, unspecified: Secondary | ICD-10-CM

## 2021-08-16 DIAGNOSIS — Z7985 Long-term (current) use of injectable non-insulin antidiabetic drugs: Secondary | ICD-10-CM

## 2021-08-16 DIAGNOSIS — Z6836 Body mass index (BMI) 36.0-36.9, adult: Secondary | ICD-10-CM

## 2021-08-16 MED ORDER — BUPROPION HCL ER (SR) 150 MG PO TB12: 150.0000 mg | ORAL_TABLET | Freq: Every day | ORAL | 0 refills | Status: DC

## 2021-08-16 MED ORDER — OZEMPIC (0.25 OR 0.5 MG/DOSE) 2 MG/3ML ~~LOC~~ SOPN: PEN_INJECTOR | SUBCUTANEOUS | 0 refills | Status: DC

## 2021-08-16 NOTE — Progress Notes (Signed)
Chief Complaint:   OBESITY Kristen Holland is here to discuss her progress with her obesity treatment plan along with follow-up of her obesity related diagnoses. Kristen Holland is on the Category 1 Plan and states she is following her eating plan approximately 50% of the time. Kristen Holland states she is doing 0 minutes 0 times per week.  Today's visit was #: 9 Starting weight: 217 lbs Starting date: 07/15/2021 Today's weight: 219 lbs Today's date: 08/16/2021 Total lbs lost to date: 0 Total lbs lost since last in-office visit: 0  Interim History: Kristen Holland is up 3 lbs since her last visit. She has been eating out.   Subjective:   1. Diabetes mellitus type 2 in obese ([facility]) Kristen Holland is currently taking Kristen Holland. Insurance no longer covers [employer]. We will check A1C, insulin, microalbumin, lipid panel, and CMP today.  2. Vitamin D deficiency Kristen Holland is not on Vitamin D currently.   3. Other depression, with emotional eating Kristen Holland notes cravings. She is taking Kristen Holland as directed.   Assessment/Plan:   1. Diabetes mellitus type 2 in obese Kristen Holland) Kristen Holland agrees to start Kristen Holland 0.5 mg for 1 month with no refills. Good blood sugar control is important to decrease the likelihood of diabetic complications such as nephropathy, neuropathy, limb loss, blindness, coronary artery disease, and death. Intensive lifestyle modification including diet, exercise and weight loss are the first line of treatment for diabetes.   - Microalbumin / creatinine urine ratio - Insulin, random - Hemoglobin A1c - Comprehensive metabolic panel - Lipid Panel With LDL/HDL Ratio  - Semaglutide,0.25 or 0.5MG /DOS, (Kristen Holland, 0.25 OR 0.5 MG/DOSE,) 2 MG/3ML SOPN; Will inject 0.5 mg into the skin weekly  Dispense: 3 mL; Refill: 0  2. Vitamin D deficiency Low Vitamin D level contributes to fatigue and are associated with obesity, breast, and colon cancer. We will check Vitamin D and Kristen Holland will follow-up for routine testing of Vitamin D, at  least 2-3 times per year to avoid over-replacement.  - VITAMIN D 25 Hydroxy (Vit-D Deficiency, Fractures)  3. Other depression, with emotional eating Behavior modification techniques were discussed today to help Kristen Holland deal with her emotional/non-hunger eating behaviors.  We will refill Kristen Holland 150 mg for 1 month with no refills. Orders and follow up as documented in patient record.   - buPROPion (Kristen Holland SR) 150 MG 12 hr tablet; Take 1 tablet (150 mg total) by mouth daily.  Dispense: 30 tablet; Refill: 0  4. Obesity, current BMI 36.4 Kristen Holland is currently in the action stage of change. As such, her goal is to continue with weight loss efforts. She has agreed to the Category 1 Plan.   Kristen Holland will increase protein and water intake. Handout on New low carbohydrate snacks.   Exercise goals: No exercise has been prescribed at this time.  Behavioral modification strategies: increasing lean protein intake, decreasing simple carbohydrates, increasing vegetables, increasing water intake, decreasing eating out, no skipping meals, meal planning and cooking strategies, keeping healthy foods in the home, and planning for success.  Kristen Holland has agreed to follow-up with our clinic in 4 weeks. She was informed of the importance of frequent follow-up visits to maximize her success with intensive lifestyle modifications for her multiple health conditions.   Kristen Holland was informed we would discuss her lab results at her next visit unless there is a critical issue that needs to be addressed sooner. Kristen Holland agreed to keep her next visit at the agreed upon time to discuss these results.  Objective:   Blood pressure 106/62,  pulse 74, temperature 98 F (36.7 C), height 5\' 5"  (1.651 m), weight 219 lb (99.3 kg), SpO2 96 %. Body mass index is 36.44 kg/m.  General: Cooperative, alert, well developed, in no acute distress. HEENT: Conjunctivae and lids unremarkable. Cardiovascular: Regular rhythm.  Lungs: Normal  work of breathing. Neurologic: No focal deficits.   Lab Results  Component Value Date   CREATININE 1.00 06/25/2021   BUN 7 07/15/2020   NA 142 07/15/2020   K 4.4 07/15/2020   CL 100 07/15/2020   CO2 26 07/15/2020   Lab Results  Component Value Date   ALT 19 07/15/2020   AST 16 07/15/2020   ALKPHOS 92 07/15/2020   BILITOT 0.3 07/15/2020   Lab Results  Component Value Date   HGBA1C 6.6 (H) 07/15/2020   HGBA1C 5.3 07/15/2012   Lab Results  Component Value Date   INSULIN 12.3 07/15/2020   Lab Results  Component Value Date   TSH 1.010 07/15/2020   Lab Results  Component Value Date   CHOL 175 07/15/2020   HDL 42 07/15/2020   LDLCALC 110 (H) 07/15/2020   TRIG 130 07/15/2020   Lab Results  Component Value Date   VD25OH 116.0 (H) 07/15/2020   Lab Results  Component Value Date   WBC 8.7 05/10/2020   HGB 12.5 05/10/2020   HCT 39.2 05/10/2020   MCV 88.5 05/10/2020   PLT 372 05/10/2020   No results found for: "IRON", "TIBC", "FERRITIN"  Attestation Statements:   Reviewed by clinician on day of visit: allergies, medications, problem list, medical history, surgical history, family history, social history, and previous encounter notes.  I, 07/10/2020, RMA, am acting as Jackson Latino for Energy manager, DO.  I have reviewed the above documentation for accuracy and completeness, and I agree with the above. [employer], DO

## 2021-08-17 ENCOUNTER — Encounter (INDEPENDENT_AMBULATORY_CARE_PROVIDER_SITE_OTHER): Payer: Self-pay | Admitting: Bariatrics

## 2021-08-17 LAB — MICROALBUMIN / CREATININE URINE RATIO
Creatinine, Urine: 115.2 mg/dL
Microalb/Creat Ratio: <3 mg/g creat (ref 0–29)
Microalbumin, Urine: <3 ug/mL

## 2021-08-19 ENCOUNTER — Other Ambulatory Visit (INDEPENDENT_AMBULATORY_CARE_PROVIDER_SITE_OTHER): Payer: Self-pay | Admitting: Bariatrics

## 2021-08-19 ENCOUNTER — Encounter (INDEPENDENT_AMBULATORY_CARE_PROVIDER_SITE_OTHER): Payer: Self-pay

## 2021-08-19 ENCOUNTER — Telehealth (INDEPENDENT_AMBULATORY_CARE_PROVIDER_SITE_OTHER): Payer: Self-pay | Admitting: Bariatrics

## 2021-08-19 MED ORDER — TIRZEPATIDE 5 MG/0.5ML ~~LOC~~ SOAJ: 5.0000 mg | SUBCUTANEOUS | 0 refills | Status: DC

## 2021-08-19 NOTE — Telephone Encounter (Signed)
Patient was seen on 08/16/21 and she was given Ozempic. Can you please advise?

## 2021-08-19 NOTE — Telephone Encounter (Signed)
Pt is having a hard time finding the medication that Dr. Manson Passey prescribed. Please call patient at [phone]. Thank You!

## 2021-08-19 NOTE — Telephone Encounter (Signed)
Patient wants to try Mounjaro instead on Ozempic. She was originally started on [facility] but was not approved and switched to Ozempic. Greggory Keen has since been approved by insurance. Can you please advise?

## 2021-08-24 LAB — COMPREHENSIVE METABOLIC PANEL
ALT: 20 IU/L (ref 0–32)
AST: 14 IU/L (ref 0–40)
Albumin/Globulin Ratio: 2 (ref 1.2–2.2)
Albumin: 4.5 g/dL (ref 3.8–4.9)
Alkaline Phosphatase: 101 IU/L (ref 44–121)
BUN/Creatinine Ratio: 15 (ref 9–23)
BUN: 12 mg/dL (ref 6–24)
Bilirubin Total: 0.3 mg/dL (ref 0.0–1.2)
CO2: 22 mmol/L (ref 20–29)
Calcium: 10.1 mg/dL (ref 8.7–10.2)
Chloride: 99 mmol/L (ref 96–106)
Creatinine, Ser: 0.82 mg/dL (ref 0.57–1.00)
Globulin, Total: 2.3 g/dL (ref 1.5–4.5)
Glucose: 95 mg/dL (ref 70–99)
Potassium: 4.5 mmol/L (ref 3.5–5.2)
Sodium: 141 mmol/L (ref 134–144)
Total Protein: 6.8 g/dL (ref 6.0–8.5)
eGFR: 85 mL/min/{1.73_m2} (ref >59)

## 2021-08-24 LAB — LIPID PANEL WITH LDL/HDL RATIO
Cholesterol, Total: 159 mg/dL (ref 100–199)
HDL: 52 mg/dL (ref >39)
LDL Chol Calc (NIH): 94 mg/dL (ref 0–99)
LDL/HDL Ratio: 1.8 ratio (ref 0.0–3.2)
Triglycerides: 69 mg/dL (ref 0–149)
VLDL Cholesterol Cal: 13 mg/dL (ref 5–40)

## 2021-08-24 LAB — VITAMIN D 25 HYDROXY (VIT D DEFICIENCY, FRACTURES): Vit D, 25-Hydroxy: 45.3 ng/mL (ref 30.0–100.0)

## 2021-08-24 LAB — HEMOGLOBIN A1C
Est. average glucose Bld gHb Est-mCnc: 128 mg/dL
Hgb A1c MFr Bld: 6.1 % — ABNORMAL HIGH (ref 4.8–5.6)

## 2021-08-24 LAB — INSULIN, RANDOM: INSULIN: 16.9 u[IU]/mL (ref 2.6–24.9)

## 2021-08-30 ENCOUNTER — Other Ambulatory Visit (INDEPENDENT_AMBULATORY_CARE_PROVIDER_SITE_OTHER): Payer: Self-pay | Admitting: Bariatrics

## 2021-08-30 DIAGNOSIS — F3289 Other specified depressive episodes: Secondary | ICD-10-CM

## 2021-09-09 ENCOUNTER — Other Ambulatory Visit (INDEPENDENT_AMBULATORY_CARE_PROVIDER_SITE_OTHER): Payer: Self-pay | Admitting: Bariatrics

## 2021-09-09 ENCOUNTER — Encounter (INDEPENDENT_AMBULATORY_CARE_PROVIDER_SITE_OTHER): Payer: Self-pay | Admitting: Bariatrics

## 2021-09-09 ENCOUNTER — Ambulatory Visit (INDEPENDENT_AMBULATORY_CARE_PROVIDER_SITE_OTHER): Payer: BC Managed Care – PPO | Admitting: Bariatrics

## 2021-09-09 VITALS — BP 94/60 | HR 77 | Temp 97.7°F | Ht 65.0 in | Wt 213.0 lb

## 2021-09-09 DIAGNOSIS — Z6835 Body mass index (BMI) 35.0-35.9, adult: Secondary | ICD-10-CM

## 2021-09-09 DIAGNOSIS — E669 Obesity, unspecified: Secondary | ICD-10-CM

## 2021-09-09 DIAGNOSIS — E1169 Type 2 diabetes mellitus with other specified complication: Secondary | ICD-10-CM

## 2021-09-09 DIAGNOSIS — Z7985 Long-term (current) use of injectable non-insulin antidiabetic drugs: Secondary | ICD-10-CM

## 2021-09-09 DIAGNOSIS — F3289 Other specified depressive episodes: Secondary | ICD-10-CM | POA: Diagnosis not present

## 2021-09-09 MED ORDER — TIRZEPATIDE 7.5 MG/0.5ML ~~LOC~~ SOAJ: 7.5000 mg | SUBCUTANEOUS | 0 refills | Status: DC

## 2021-09-09 MED ORDER — BUPROPION HCL ER (SR) 150 MG PO TB12: 150.0000 mg | ORAL_TABLET | Freq: Every day | ORAL | 0 refills | Status: DC

## 2021-09-09 NOTE — Telephone Encounter (Signed)
Prior authorization has been started for [facility]. Will notify patient and provider once a response is received.

## 2021-09-09 NOTE — Telephone Encounter (Signed)
Please check PA, Thanks

## 2021-09-12 NOTE — Progress Notes (Unsigned)
Chief Complaint:   OBESITY Kristen Holland is here to discuss her progress with her obesity treatment plan along with follow-up of her obesity related diagnoses. Kristen Holland is on the Category 1 Plan and states she is following her eating plan approximately 50% of the time. Kristen Holland states she is doing 0 minutes 0 times per week.  Today's visit was #: 10 Starting weight: 217 lbs Starting date: 07/15/2021 Today's weight: 213 lbs Today's date: 09/09/2021 Total lbs lost to date: 4 Total lbs lost since last in-office visit: 6  Interim History: Kristen Holland is down an additional 6 pounds since her last visit.  She is doing okay with her water and protein intake.  Subjective:   1. Diabetes mellitus type 2 in obese [facility]) Kristen Holland is taking Mounjaro, and she denies side effects.  2. Other depression, with emotional eating Kristen Holland is taking Wellbutrin currently.  Assessment/Plan:   1. Diabetes mellitus type 2 in obese [facility]) Kristen Holland agreed to increase Mounjaro to 7.5 mg once weekly, with no refills.  We will follow-up at her next visit.  - tirzepatide (MOUNJARO) 7.5 MG/0.5ML Pen; Inject 7.5 mg into the skin once a week.  Dispense: 2 mL; Refill: 0  2. Other depression, with emotional eating Kristen Holland agreed to continue taking Wellbutrin SR 150 mg once daily, and we will refill for 1 month.  - buPROPion (WELLBUTRIN SR) 150 MG 12 hr tablet; Take 1 tablet (150 mg total) by mouth daily.  Dispense: 30 tablet; Refill: 0  3. Obesity, current BMI 35.4 Kristen Holland is currently in the action stage of change. As such, her goal is to continue with weight loss efforts. She has agreed to the Category 1 Plan.   Meal planning and intentional eating were discussed.  Exercise goals: No exercise has been prescribed at this time.  Behavioral modification strategies: increasing lean protein intake, decreasing simple carbohydrates, increasing vegetables, increasing water intake, decreasing eating out, no skipping meals, meal planning  and cooking strategies, keeping healthy foods in the home, and planning for success.  Kristen Holland has agreed to follow-up with our clinic in 3 to 4 weeks. She was informed of the importance of frequent follow-up visits to maximize her success with intensive lifestyle modifications for her multiple health conditions.   Objective:   Blood pressure 94/60, pulse 77, temperature 97.7 F (36.5 C), height 5\' 5"  (1.651 m), weight 213 lb (96.6 kg), SpO2 97 %. Body mass index is 35.45 kg/m.  General: Cooperative, alert, well developed, in no acute distress. HEENT: Conjunctivae and lids unremarkable. Cardiovascular: Regular rhythm.  Lungs: Normal work of breathing. Neurologic: No focal deficits.   Lab Results  Component Value Date   CREATININE 0.82 08/16/2021   BUN 12 08/16/2021   NA 141 08/16/2021   K 4.5 08/16/2021   CL 99 08/16/2021   CO2 22 08/16/2021   Lab Results  Component Value Date   ALT 20 08/16/2021   AST 14 08/16/2021   ALKPHOS 101 08/16/2021   BILITOT 0.3 08/16/2021   Lab Results  Component Value Date   HGBA1C 6.1 (H) 08/16/2021   HGBA1C 6.6 (H) 07/15/2020   HGBA1C 5.3 07/15/2012   Lab Results  Component Value Date   INSULIN 16.9 08/16/2021   INSULIN 12.3 07/15/2020   Lab Results  Component Value Date   TSH 1.010 07/15/2020   Lab Results  Component Value Date   CHOL 159 08/16/2021   HDL 52 08/16/2021   LDLCALC 94 08/16/2021   TRIG 69 08/16/2021   Lab  Results  Component Value Date   VD25OH 45.3 08/16/2021   VD25OH 116.0 (H) 07/15/2020   Lab Results  Component Value Date   WBC 8.7 05/10/2020   HGB 12.5 05/10/2020   HCT 39.2 05/10/2020   MCV 88.5 05/10/2020   PLT 372 05/10/2020   No results found for: "IRON", "TIBC", "FERRITIN"  Attestation Statements:   Reviewed by clinician on day of visit: allergies, medications, problem list, medical history, surgical history, family history, social history, and previous encounter notes.   Trude Mcburney, am  acting as Energy manager for [employer], DO.  I have reviewed the above documentation for accuracy and completeness, and I agree with the above. Corinna Capra, DO

## 2021-09-13 ENCOUNTER — Telehealth (INDEPENDENT_AMBULATORY_CARE_PROVIDER_SITE_OTHER): Payer: Self-pay | Admitting: Bariatrics

## 2021-09-13 ENCOUNTER — Ambulatory Visit (INDEPENDENT_AMBULATORY_CARE_PROVIDER_SITE_OTHER): Payer: BC Managed Care – PPO | Admitting: Bariatrics

## 2021-09-13 ENCOUNTER — Encounter (INDEPENDENT_AMBULATORY_CARE_PROVIDER_SITE_OTHER): Payer: Self-pay

## 2021-09-13 NOTE — Telephone Encounter (Signed)
Dr. Manson Passey - Prior authorization approved for [place]. Per insurance ([employer] - [employer]) effective 09/09/2021 - 09/10/2022. Patient sent approval message via mychart.

## 2021-09-14 ENCOUNTER — Encounter (INDEPENDENT_AMBULATORY_CARE_PROVIDER_SITE_OTHER): Payer: Self-pay | Admitting: Bariatrics

## 2021-09-16 ENCOUNTER — Other Ambulatory Visit (INDEPENDENT_AMBULATORY_CARE_PROVIDER_SITE_OTHER): Payer: Self-pay | Admitting: Bariatrics

## 2021-10-07 ENCOUNTER — Other Ambulatory Visit (INDEPENDENT_AMBULATORY_CARE_PROVIDER_SITE_OTHER): Payer: Self-pay | Admitting: Bariatrics

## 2021-10-07 DIAGNOSIS — F3289 Other specified depressive episodes: Secondary | ICD-10-CM

## 2021-10-10 ENCOUNTER — Other Ambulatory Visit (INDEPENDENT_AMBULATORY_CARE_PROVIDER_SITE_OTHER): Payer: Self-pay | Admitting: Bariatrics

## 2021-10-10 DIAGNOSIS — E669 Obesity, unspecified: Secondary | ICD-10-CM

## 2021-10-11 ENCOUNTER — Ambulatory Visit (INDEPENDENT_AMBULATORY_CARE_PROVIDER_SITE_OTHER): Payer: BC Managed Care – PPO | Admitting: Bariatrics

## 2021-10-12 ENCOUNTER — Other Ambulatory Visit (INDEPENDENT_AMBULATORY_CARE_PROVIDER_SITE_OTHER): Payer: Self-pay | Admitting: Bariatrics

## 2021-10-12 DIAGNOSIS — E1169 Type 2 diabetes mellitus with other specified complication: Secondary | ICD-10-CM

## 2021-10-12 DIAGNOSIS — F3289 Other specified depressive episodes: Secondary | ICD-10-CM

## 2021-10-12 NOTE — Telephone Encounter (Signed)
Please advise 

## 2021-10-13 ENCOUNTER — Encounter (INDEPENDENT_AMBULATORY_CARE_PROVIDER_SITE_OTHER): Payer: Self-pay

## 2021-10-15 ENCOUNTER — Other Ambulatory Visit (INDEPENDENT_AMBULATORY_CARE_PROVIDER_SITE_OTHER): Payer: Self-pay | Admitting: Bariatrics

## 2021-10-15 DIAGNOSIS — E669 Obesity, unspecified: Secondary | ICD-10-CM

## 2021-10-18 ENCOUNTER — Encounter (INDEPENDENT_AMBULATORY_CARE_PROVIDER_SITE_OTHER): Payer: Self-pay | Admitting: Bariatrics

## 2021-10-18 ENCOUNTER — Encounter (INDEPENDENT_AMBULATORY_CARE_PROVIDER_SITE_OTHER): Payer: Self-pay | Admitting: Family Medicine

## 2021-10-18 ENCOUNTER — Telehealth (INDEPENDENT_AMBULATORY_CARE_PROVIDER_SITE_OTHER): Payer: BC Managed Care – PPO | Admitting: Family Medicine

## 2021-10-18 DIAGNOSIS — E669 Obesity, unspecified: Secondary | ICD-10-CM | POA: Diagnosis not present

## 2021-10-18 DIAGNOSIS — E1169 Type 2 diabetes mellitus with other specified complication: Secondary | ICD-10-CM

## 2021-10-18 DIAGNOSIS — Z7984 Long term (current) use of oral hypoglycemic drugs: Secondary | ICD-10-CM

## 2021-10-18 DIAGNOSIS — F3289 Other specified depressive episodes: Secondary | ICD-10-CM | POA: Diagnosis not present

## 2021-10-18 DIAGNOSIS — Z7985 Long-term (current) use of injectable non-insulin antidiabetic drugs: Secondary | ICD-10-CM

## 2021-10-18 DIAGNOSIS — Z6835 Body mass index (BMI) 35.0-35.9, adult: Secondary | ICD-10-CM

## 2021-10-18 MED ORDER — TIRZEPATIDE 7.5 MG/0.5ML ~~LOC~~ SOAJ: 7.5000 mg | SUBCUTANEOUS | 0 refills | Status: DC

## 2021-10-18 NOTE — Telephone Encounter (Signed)
Dr. Manson Passey, Patient was last seen on 09/09/21 and canceled on 7/10 and 8/7. Requesting a refill for [facility]

## 2021-10-18 NOTE — Progress Notes (Signed)
TeleHealth Visit:  This visit was completed with telemedicine (audio/video) technology. Kristen Holland has verbally consented to this TeleHealth visit. The patient is located at home, the provider is located at home. The participants in this visit include the listed provider and patient. The visit was conducted today via MyChart video.  OBESITY Kristen Holland is here to discuss her progress with her obesity treatment plan along with follow-up of her obesity related diagnoses.   Today's visit was # 11 Starting weight: 217 lbs Starting date: 07/15/2021 Weight at last in office visit: 213 lbs on 09/09/21 Total weight loss: 4 lbs at last in office visit on 09/09/21. Today's reported weight:  No weight reported.  Nutrition Plan: the Category 1 Plan. - 50% adherence. Hunger is well controlled. Cravings are poorly controlled.  Current exercise: none  Interim History: Kristen Holland is under a great deal of stress due to the health issues of her husband (recently diagnosed with prostate cancer) and 17 year old son (heart failure and recently started home hemodialysis).  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.  She skips meals often, tends to eat too many sweets due to sweets cravings. She has a protein shake for breakfast.  Was eating out a lot before she went on leave of absence from her job. She is concerned about the amount of sodium in a frozen meal, I told her that eating out has much more sodium than in frozen meal at home.  Assessment/Plan:  1. Type II Diabetes HgbA1c is at goal. Last A1c was 6.1 CBGs: Fasting 86-142 Episodes of hypoglycemia: no Medication(s): Mounjaro 7.5 mg weekly, metformin XR 500 mg daily with meal. Reports constipation-takes MiraLAX. Appetite well controlled. On statin.  Lab Results  Component Value Date   HGBA1C 6.1 (H) 08/16/2021   HGBA1C 6.6 (H) 07/15/2020   HGBA1C 5.3 07/15/2012   Lab Results  Component Value Date   LDLCALC 94  08/16/2021   CREATININE 0.82 08/16/2021    Plan: Refill Mounjaro 7.5 mg weekly. Continue metformin XR 500 mg daily.   2. Other depression/emotional eating Kristen Holland has had issues with stress/emotional eating.  She craves sweets all day. Currently this is poorly controlled. Overall mood is stable.  Medication(s): Bupropion 150 mg once daily.  Plan: Increase dose of bupropion at next refill.  3. Obesity: Current BMI 35.4 Kristen Holland is currently in the action stage of change. As such, her goal is to continue with weight loss efforts.  She has agreed to the Category 1 Plan.   1.  Cook 2 meals per week with enough for leftovers 2.  May have protein shake at breakfast 3.  Have frozen meals available to use at home. 4.  Discussed importance of protein.  Exercise goals: No exercise has been prescribed at this time.  Behavioral modification strategies: increasing lean protein intake, decreasing simple carbohydrates, decreasing eating out, no skipping meals, and meal planning and cooking strategies.  Kristen Holland has agreed to follow-up with our clinic in 3 weeks.   No orders of the defined types were placed in this encounter.   Medications Discontinued During This Encounter  Medication Reason   tirzepatide [facility]) 7.5 MG/0.5ML Pen Reorder     Meds ordered this encounter  Medications   tirzepatide (MOUNJARO) 7.5 MG/0.5ML Pen    Sig: Inject 7.5 mg into the skin once a week.    Dispense:  2 mL    Refill:  0    Order Specific Question:   Supervising Provider  Answer:   Quillian Quince D [[id number]]      Objective:   VITALS: Per patient if applicable, see vitals. GENERAL: Alert and in no acute distress. CARDIOPULMONARY: No increased WOB. Speaking in clear sentences.  PSYCH: Pleasant and cooperative. Speech normal rate and rhythm. Affect is appropriate. Insight and judgement are appropriate. Attention is focused, linear, and appropriate.  NEURO: Oriented as arrived to appointment on  time with no prompting.   Lab Results  Component Value Date   CREATININE 0.82 08/16/2021   BUN 12 08/16/2021   NA 141 08/16/2021   K 4.5 08/16/2021   CL 99 08/16/2021   CO2 22 08/16/2021   Lab Results  Component Value Date   ALT 20 08/16/2021   AST 14 08/16/2021   ALKPHOS 101 08/16/2021   BILITOT 0.3 08/16/2021   Lab Results  Component Value Date   HGBA1C 6.1 (H) 08/16/2021   HGBA1C 6.6 (H) 07/15/2020   HGBA1C 5.3 07/15/2012   Lab Results  Component Value Date   INSULIN 16.9 08/16/2021   INSULIN 12.3 07/15/2020   Lab Results  Component Value Date   TSH 1.010 07/15/2020   Lab Results  Component Value Date   CHOL 159 08/16/2021   HDL 52 08/16/2021   LDLCALC 94 08/16/2021   TRIG 69 08/16/2021   Lab Results  Component Value Date   WBC 8.7 05/10/2020   HGB 12.5 05/10/2020   HCT 39.2 05/10/2020   MCV 88.5 05/10/2020   PLT 372 05/10/2020   No results found for: "IRON", "TIBC", "FERRITIN" Lab Results  Component Value Date   VD25OH 45.3 08/16/2021   VD25OH 116.0 (H) 07/15/2020    Attestation Statements:   Reviewed by clinician on day of visit: allergies, medications, problem list, medical history, surgical history, family history, social history, and previous encounter notes.

## 2021-10-19 ENCOUNTER — Ambulatory Visit
Admission: RE | Admit: 2021-10-19 | Discharge: 2021-10-19 | Disposition: A | Discharge status: Home / Self Care | Payer: BC Managed Care – PPO | Source: Ambulatory Visit | Attending: Physician Assistant | Admitting: Physician Assistant

## 2021-10-19 ENCOUNTER — Other Ambulatory Visit: Payer: Self-pay | Admitting: Physician Assistant

## 2021-10-19 DIAGNOSIS — W540XXA Bitten by dog, initial encounter: Secondary | ICD-10-CM

## 2021-11-09 ENCOUNTER — Ambulatory Visit (INDEPENDENT_AMBULATORY_CARE_PROVIDER_SITE_OTHER): Payer: BC Managed Care – PPO | Admitting: Bariatrics

## 2021-11-09 ENCOUNTER — Encounter (INDEPENDENT_AMBULATORY_CARE_PROVIDER_SITE_OTHER): Payer: Self-pay | Admitting: Bariatrics

## 2021-11-09 VITALS — BP 103/62 | HR 75 | Temp 97.8°F | Ht 65.0 in | Wt 206.0 lb

## 2021-11-09 DIAGNOSIS — F3289 Other specified depressive episodes: Secondary | ICD-10-CM

## 2021-11-09 DIAGNOSIS — Z7985 Long-term (current) use of injectable non-insulin antidiabetic drugs: Secondary | ICD-10-CM

## 2021-11-09 DIAGNOSIS — E1169 Type 2 diabetes mellitus with other specified complication: Secondary | ICD-10-CM

## 2021-11-09 DIAGNOSIS — F32A Depression, unspecified: Secondary | ICD-10-CM | POA: Insufficient documentation

## 2021-11-09 DIAGNOSIS — E669 Obesity, unspecified: Secondary | ICD-10-CM

## 2021-11-09 DIAGNOSIS — Z6834 Body mass index (BMI) 34.0-34.9, adult: Secondary | ICD-10-CM

## 2021-11-09 MED ORDER — TIRZEPATIDE 7.5 MG/0.5ML ~~LOC~~ SOAJ: 7.5000 mg | SUBCUTANEOUS | 0 refills | Status: AC

## 2021-11-09 MED ORDER — BUPROPION HCL ER (SR) 150 MG PO TB12: 150.0000 mg | ORAL_TABLET | Freq: Every day | ORAL | 0 refills | Status: DC

## 2021-11-18 NOTE — Progress Notes (Unsigned)
Chief Complaint:   OBESITY Kristen Holland is here to discuss her progress with her obesity treatment plan along with follow-up of her obesity related diagnoses. [name] is on the Category 1 Plan and states she is following her eating plan approximately 50% of the time. [name] states she is walking for 10 minutes 3 times per week.  Today's visit was #: 12 Starting weight: 217 lbs Starting date: 07/15/2021 Today's weight: 206 lbs Today's date: 11/09/2021 Total lbs lost to date: 11 Total lbs lost since last in-office visit: 7  Interim History: [name] is down 7 pounds since her last visit.  She has stuck closely to the plan.  She has been tempted by fast food.  Subjective:   1. Diabetes mellitus type 2 in obese (HCC) Kristen Holland is taking Mounjaro and she notes it is controlling her appetite.  2. Other depression, with emotional eating Kristen Holland is taking Wellbutrin.  Assessment/Plan:   1. Diabetes mellitus type 2 in obese (HCC) Tura will continue Mounjaro 7.5 mg once weekly, and we will refill for 1 month.  - tirzepatide (MOUNJARO) 7.5 MG/0.5ML Pen; Inject 7.5 mg into the skin once a week.  Dispense: 2 mL; Refill: 0  2. Other depression, with emotional eating [name] will continue Wellbutrin SR 150 mg once daily, and we will refill for 1 month.  - buPROPion (WELLBUTRIN SR) 150 MG 12 hr tablet; Take 1 tablet (150 mg total) by mouth daily.  Dispense: 30 tablet; Refill: 0  3. Obesity, current BMI 34.3 Kristen Holland is currently in the action stage of change. As such, her goal is to continue with weight loss efforts. She has agreed to the Category 1 Plan.   Kristen Holland will decrease her carbohydrates and keep her water intake high.  Exercise goals: As is.   Behavioral modification strategies: increasing lean protein intake, decreasing simple carbohydrates, increasing vegetables, increasing water intake, decreasing eating out, no skipping meals, meal planning and cooking strategies, and keeping healthy  foods in the home.  Kristen Holland has agreed to follow-up with our clinic in 4 weeks. She was informed of the importance of frequent follow-up visits to maximize her success with intensive lifestyle modifications for her multiple health conditions.   Objective:   Blood pressure 103/62, pulse 75, temperature 97.8 F (36.6 C), height 5\' 5"  (1.651 m), weight 206 lb (93.4 kg), SpO2 98 %. Body mass index is 34.28 kg/m.  General: Cooperative, alert, well developed, in no acute distress. HEENT: Conjunctivae and lids unremarkable. Cardiovascular: Regular rhythm.  Lungs: Normal work of breathing. Neurologic: No focal deficits.   Lab Results  Component Value Date   CREATININE 0.82 08/16/2021   BUN 12 08/16/2021   NA 141 08/16/2021   K 4.5 08/16/2021   CL 99 08/16/2021   CO2 22 08/16/2021   Lab Results  Component Value Date   ALT 20 08/16/2021   AST 14 08/16/2021   ALKPHOS 101 08/16/2021   BILITOT 0.3 08/16/2021   Lab Results  Component Value Date   HGBA1C 6.1 (H) 08/16/2021   HGBA1C 6.6 (H) 07/15/2020   HGBA1C 5.3 07/15/2012   Lab Results  Component Value Date   INSULIN 16.9 08/16/2021   INSULIN 12.3 07/15/2020   Lab Results  Component Value Date   TSH 1.010 07/15/2020   Lab Results  Component Value Date   CHOL 159 08/16/2021   HDL 52 08/16/2021   LDLCALC 94 08/16/2021   TRIG 69 08/16/2021   Lab Results  Component Value Date   VD25OH  45.3 08/16/2021   VD25OH 116.0 (H) 07/15/2020   Lab Results  Component Value Date   WBC 8.7 05/10/2020   HGB 12.5 05/10/2020   HCT 39.2 05/10/2020   MCV 88.5 05/10/2020   PLT 372 05/10/2020   No results found for: "IRON", "TIBC", "FERRITIN"  Attestation Statements:   Reviewed by clinician on day of visit: allergies, medications, problem list, medical history, surgical history, family history, social history, and previous encounter notes.   Trude Mcburney, am acting as Energy manager for [employer], DO.  I have reviewed the  above documentation for accuracy and completeness, and I agree with the above. Corinna Capra, DO

## 2021-11-22 ENCOUNTER — Encounter (INDEPENDENT_AMBULATORY_CARE_PROVIDER_SITE_OTHER): Payer: Self-pay | Admitting: Bariatrics

## 2021-12-07 ENCOUNTER — Other Ambulatory Visit (INDEPENDENT_AMBULATORY_CARE_PROVIDER_SITE_OTHER): Payer: Self-pay | Admitting: Bariatrics

## 2021-12-07 DIAGNOSIS — F3289 Other specified depressive episodes: Secondary | ICD-10-CM

## 2021-12-08 ENCOUNTER — Other Ambulatory Visit (INDEPENDENT_AMBULATORY_CARE_PROVIDER_SITE_OTHER): Payer: Self-pay | Admitting: Bariatrics

## 2021-12-08 DIAGNOSIS — F3289 Other specified depressive episodes: Secondary | ICD-10-CM

## 2021-12-28 ENCOUNTER — Ambulatory Visit (INDEPENDENT_AMBULATORY_CARE_PROVIDER_SITE_OTHER): Payer: BC Managed Care – PPO | Admitting: Internal Medicine

## 2022-02-07 ENCOUNTER — Other Ambulatory Visit (INDEPENDENT_AMBULATORY_CARE_PROVIDER_SITE_OTHER): Payer: Self-pay | Admitting: Bariatrics

## 2022-02-07 DIAGNOSIS — F3289 Other specified depressive episodes: Secondary | ICD-10-CM

## 2022-03-06 ENCOUNTER — Ambulatory Visit
Admission: EM | Admit: 2022-03-06 | Discharge: 2022-03-06 | Disposition: A | Discharge status: Home / Self Care | Payer: BC Managed Care – PPO

## 2022-03-06 DIAGNOSIS — J069 Acute upper respiratory infection, unspecified: Secondary | ICD-10-CM

## 2022-03-06 LAB — POCT RAPID STREP A (OFFICE): Rapid Strep A Screen: NEGATIVE

## 2022-03-06 MED ORDER — BENZONATATE 100 MG PO CAPS: 100.0000 mg | ORAL_CAPSULE | Freq: Three times a day (TID) | ORAL | 0 refills | Status: AC | PRN

## 2022-03-06 NOTE — Discharge Instructions (Addendum)
Your strep test is negative.   ? ?Take the Tessalon Perles as needed for cough.   ? ?Follow up with your primary care provider if your symptoms are not improving.   ? ?

## 2022-03-06 NOTE — ED Triage Notes (Signed)
Patient to Urgent Care with complaints of sore throat x3 days. Reports ringing in bilateral ears, worse in the left.   Denies any known fevers.  Has been taking mucinex.

## 2022-03-06 NOTE — ED Provider Notes (Signed)
Kristen Holland    CSN: [id number] Arrival date & time: 03/06/22  4332      History   Chief Complaint Chief Complaint  Patient presents with   Sore Throat    HPI Kristen Holland is a 56 y.o. female.  Patient presents with 3-day history of ear pain and sore throat.  She also reports cough.  No fever, rash, shortness of breath, vomiting, diarrhea, or other symptoms.  Treatment at home with Mucinex; last taken yesterday.  Her medical history includes hypertension and diabetes.    The history is provided by the patient and medical records.    Past Medical History:  Diagnosis Date   Abdominal pain    Allergy    Arthritis    Asthma    Back pain    Constipation    Depression    Diabetes mellitus without complication (HCC)    Fatty liver    Gallbladder problem    GERD (gastroesophageal reflux disease)    Hyperlipidemia    Hypertension    Joint pain    Nausea & vomiting    Seasonal allergies    Sleep apnea    Vitamin D deficiency     Patient Active Problem List   Diagnosis Date Noted   Depression 11/09/2021   Class 2 severe obesity with serious comorbidity and body mass index (BMI) of 36.0 to 36.9 in adult [facility]) 11/09/2021   Essential hypertension 05/13/2017   Diabetes mellitus type 2 in obese (HCC) 05/13/2017   Hypercholesterolemia 05/13/2017   Obesity with serious comorbidity 05/13/2017   Family history of CHF (congestive heart failure) 05/13/2017   Diabetes (HCC) 12/29/2011   RUQ pain 12/29/2011   Constipation 12/29/2011    Past Surgical History:  Procedure Laterality Date   ABDOMINAL HYSTERECTOMY     APPENDECTOMY     BACK SURGERY      OB History     Gravida  3   Para  2   Term      Preterm      AB      Living         SAB      IAB      Ectopic      Multiple      Live Births               Home Medications    Prior to Admission medications   Medication Sig Start Date End Date Taking? Authorizing Provider  benzonatate  (TESSALON) 100 MG capsule Take 1 capsule (100 mg total) by mouth 3 (three) times daily as needed for cough. 03/06/22  Yes Mickie Bail, NP  FLUoxetine (PROZAC) 20 MG capsule Take 20 mg by mouth daily. 03/01/22  Yes [provider]  LINZESS 290 MCG CAPS capsule 1 cap(s) orally 20 minutes before breakfast for 90 days 01/20/22  Yes [provider]  albuterol (PROVENTIL HFA;VENTOLIN HFA) 108 (90 BASE) MCG/ACT inhaler Inhale 1-2 puffs into the lungs every 6 (six) hours as needed for wheezing or shortness of breath. Patient taking differently: Inhale 2 puffs into the lungs every 6 (six) hours as needed for wheezing or shortness of breath. 11/21/13   Mellody Drown, PA-C  aspirin EC 81 MG tablet Take by mouth.    [provider]  atorvastatin (LIPITOR) 40 MG tablet Take 40 mg by mouth daily. 10/31/14   [provider]  buPROPion (WELLBUTRIN SR) 150 MG 12 hr tablet Take 1 tablet (150 mg total) by mouth  daily. 11/09/21   Roswell Nickel, DO  dexlansoprazole (DEXILANT) 60 MG capsule Take 60 mg by mouth daily.    [provider]  fluticasone (FLOVENT HFA) 110 MCG/ACT inhaler INHALE 1 PUFF INTO THE LUNGS TWICE A DAY    [provider]  hydrochlorothiazide (HYDRODIURIL) 25 MG tablet TAKE 1 TABLET BY MOUTH EVERY DAY 02/16/18   Croitoru, Mihai, MD  irbesartan (AVAPRO) 300 MG tablet Take 300 mg by mouth daily.    [provider]  metFORMIN (GLUCOPHAGE-XR) 500 MG 24 hr tablet Take 500 mg by mouth daily. 03/03/16   [provider]  montelukast (SINGULAIR) 10 MG tablet Take 10 mg by mouth every other day.     [provider]  montelukast (SINGULAIR) 10 MG tablet 1 (one) time each day in the evening.    [provider]  tirzepatide [facility]gg[facility] Keen) 7.5 MG/0.5ML Pen Inject 7.5 mg into the skin once a week. 11/09/21   Roswell Nickel, DO    Family History Family History  Problem Relation Age of Onset   Hypertension Mother    Diabetes Mother     Cancer Mother    Depression Mother    Alcoholism Mother    Drug abuse Mother    Cancer Father    Diabetes Father    Hypertension Father    Kidney disease Father    Liver disease Father    Alcoholism Father    Heart murmur Sister    Heart failure Child     Social History Social History   Tobacco Use   Smoking status: Never   Smokeless tobacco: Never  Vaping Use   Vaping Use: Never used  Substance Use Topics   Alcohol use: No   Drug use: No     Allergies   Patient has no known allergies.   Review of Systems Review of Systems  Constitutional:  Negative for chills and fever.  HENT:  Positive for ear pain and sore throat.   Respiratory:  Positive for cough. Negative for shortness of breath.   Cardiovascular:  Negative for chest pain and palpitations.  Gastrointestinal:  Negative for diarrhea and vomiting.  Skin:  Negative for color change and rash.  All other systems reviewed and are negative.    Physical Exam Triage Vital Signs ED Triage Vitals  Enc Vitals Group     BP      Pulse      Resp      Temp      Temp src      SpO2      Weight      Height      Head Circumference      Peak Flow      Pain Score      Pain Loc      Pain Edu?      Excl. in GC?    No data found.  Updated Vital Signs BP 103/72   Pulse 83   Temp (!) 97.5 F (36.4 C)   Resp 18   Ht 5\' 5"  (1.651 m)   Wt 190 lb (86.2 kg)   SpO2 96%   BMI 31.62 kg/m   Visual Acuity Right Eye Distance:   Left Eye Distance:   Bilateral Distance:    Right Eye Near:   Left Eye Near:    Bilateral Near:     Physical Exam Vitals and nursing note reviewed.  Constitutional:      General: She is not in acute distress.  Appearance: Normal appearance. She is well-developed. She is not ill-appearing.  HENT:     Right Ear: Tympanic membrane normal.     Left Ear: Tympanic membrane normal.     Nose: Nose normal.     Mouth/Throat:     Mouth: Mucous membranes are moist.     Pharynx:  Oropharynx is clear.     Comments: Clear PND. Cardiovascular:     Rate and Rhythm: Normal rate and regular rhythm.     Heart sounds: Normal heart sounds.  Pulmonary:     Effort: Pulmonary effort is normal. No respiratory distress.     Breath sounds: Normal breath sounds.  Musculoskeletal:     Cervical back: Neck supple.  Skin:    General: Skin is warm and dry.  Neurological:     Mental Status: She is alert.  Psychiatric:        Mood and Affect: Mood normal.        Behavior: Behavior normal.      UC Treatments / Results  Labs (all labs ordered are listed, but only abnormal results are displayed) Labs Reviewed  POCT RAPID STREP A (OFFICE)    EKG   Radiology No results found.  Procedures Procedures (including critical care time)  Medications Ordered in UC Medications - No data to display  Initial Impression / Assessment and Plan / UC Course  I have reviewed the triage vital signs and the nursing notes.  Pertinent labs & imaging results that were available during my care of the patient were reviewed by me and considered in my medical decision making (see chart for details).    Viral URI.  Rapid strep negative. Patient declines COVID test. Treating cough with Tessalon Perles.  Discussed symptomatic treatment including Tylenol, rest, hydration.  Instructed patient to follow up with PCP if symptoms are not improving.  She agrees to plan of care.   Final Clinical Impressions(s) / UC Diagnoses   Final diagnoses:  Viral URI     Discharge Instructions      Your strep test is negative.    Take the [facility] as needed for cough.  Follow up with your primary care provider if your symptoms are not improving.        ED Prescriptions     Medication Sig Dispense Auth. Provider   benzonatate (TESSALON) 100 MG capsule Take 1 capsule (100 mg total) by mouth 3 (three) times daily as needed for cough. 21 capsule Sharion Balloon, NP      PDMP not reviewed this  encounter.   Sharion Balloon, NP 03/06/22 [phone]

## 2022-04-18 ENCOUNTER — Ambulatory Visit (INDEPENDENT_AMBULATORY_CARE_PROVIDER_SITE_OTHER): Payer: BC Managed Care – PPO | Admitting: Internal Medicine

## 2022-04-18 ENCOUNTER — Encounter (INDEPENDENT_AMBULATORY_CARE_PROVIDER_SITE_OTHER): Payer: Self-pay | Admitting: Internal Medicine

## 2022-04-18 VITALS — BP 112/78 | HR 71 | Temp 97.4°F | Ht 65.0 in | Wt 184.0 lb

## 2022-04-18 DIAGNOSIS — Z7985 Long-term (current) use of injectable non-insulin antidiabetic drugs: Secondary | ICD-10-CM

## 2022-04-18 DIAGNOSIS — I10 Essential (primary) hypertension: Secondary | ICD-10-CM | POA: Diagnosis not present

## 2022-04-18 DIAGNOSIS — E669 Obesity, unspecified: Secondary | ICD-10-CM | POA: Diagnosis not present

## 2022-04-18 DIAGNOSIS — E785 Hyperlipidemia, unspecified: Secondary | ICD-10-CM

## 2022-04-18 DIAGNOSIS — Z6830 Body mass index (BMI) 30.0-30.9, adult: Secondary | ICD-10-CM

## 2022-04-18 DIAGNOSIS — E1169 Type 2 diabetes mellitus with other specified complication: Secondary | ICD-10-CM

## 2022-04-18 NOTE — Assessment & Plan Note (Signed)
Blood pressure at goal for age and risk category.  On hydrochlorothiazide and Avapro.  Without adverse effects.  Most recent renal parameters reviewed which showed normal electrolytes and kidney function.  Continue with weight loss therapy.  Monitor for symptoms of orthostasis while losing weight. Continue current regimen and home monitoring for a goal blood pressure of 120/80.

## 2022-04-18 NOTE — Progress Notes (Signed)
Office: [phone]  /  Fax: [phone]  WEIGHT SUMMARY AND BIOMETRICS  Medical Weight Loss Height: 5' 5"$  (1.651 m) Weight: 184 lb (83.5 kg) Temp: (!) 97.4 F (36.3 C) Pulse Rate: 71 BP: 112/78 SpO2: 100 % Fasting: n Labs: n Today's Visit #: 14 Weight at Last VIsit: 206 lb Weight Lost Since Last Visit: 22 lb  Body Fat %: 41.9 % Fat Mass (lbs): 77.4 lbs Muscle Mass (lbs): 102 lbs Total Body Water (lbs): 72 lbs Visceral Fat Rating : 10 Peak Weight: 233 lb Starting Date: 07/15/21 Starting Weight: 217 lb Total Weight Loss (lbs): 29 lb (13.2 kg)    HPI  Chief Complaint: OBESITY  Kristen Holland is here to discuss her progress with her obesity treatment plan. She is on the the Category 1 Plan and states she is following her eating plan approximately 0 % of the time. She states she is not exercising.    Interval History:  This is my first encounter with Kristen Holland.  She is a former patient of Dr. Owens Shark. Last visit was in November. Since last office she has lost 20 pounds.  She is not following meal plan. She is on tirzepatide for T2DM prescribed by PCP.  She is not getting adequate amount of protein.  Patient also was taking a personal phone call during the visit which limited our amount of time with her.   Pharmacotherapy: Tirzepatide  PHYSICAL EXAM:  Blood pressure 112/78, pulse 71, temperature (!) 97.4 F (36.3 C), height 5' 5"$  (1.651 m), weight 184 lb (83.5 kg), SpO2 100 %. Body mass index is 30.62 kg/m.  General: She is overweight, cooperative, alert, well developed, and in no acute distress. PSYCH: Has normal mood, affect and thought process.   HEENT: EOMI, sclerae are anicteric. Lungs: Normal breathing effort, no conversational dyspnea. Extremities: No edema.  Neurologic: No gross sensory or motor deficits. No tremors or fasciculations noted.    DIAGNOSTIC DATA REVIEWED:  BMET    Component Value Date/Time   NA 141 08/16/2021 1040   K 4.5 08/16/2021 1040   CL  99 08/16/2021 1040   CO2 22 08/16/2021 1040   GLUCOSE 95 08/16/2021 1040   GLUCOSE 113 (H) 05/10/2020 1608   BUN 12 08/16/2021 1040   CREATININE 0.82 08/16/2021 1040   CREATININE 0.80 09/24/2013 0958   CALCIUM 10.1 08/16/2021 1040   GFRNONAA >60 05/10/2020 1608   GFRAA >60 03/19/2016 0705   Lab Results  Component Value Date   HGBA1C 6.1 (H) 08/16/2021   HGBA1C 5.3 07/15/2012   Lab Results  Component Value Date   INSULIN 16.9 08/16/2021   INSULIN 12.3 07/15/2020   Lab Results  Component Value Date   TSH 1.010 07/15/2020   CBC    Component Value Date/Time   WBC 8.7 05/10/2020 1644   RBC 4.43 05/10/2020 1644   HGB 12.5 05/10/2020 1644   HCT 39.2 05/10/2020 1644   PLT 372 05/10/2020 1644   MCV 88.5 05/10/2020 1644   MCV 81.2 03/14/2016 0855   MCH 28.2 05/10/2020 1644   MCHC 31.9 05/10/2020 1644   RDW 13.4 05/10/2020 1644   Iron Studies No results found for: "IRON", "TIBC", "FERRITIN", "IRONPCTSAT" Lipid Panel     Component Value Date/Time   CHOL 159 08/16/2021 1040   TRIG 69 08/16/2021 1040   HDL 52 08/16/2021 1040   LDLCALC 94 08/16/2021 1040   Hepatic Function Panel     Component Value Date/Time   PROT 6.8 08/16/2021 1040  ALBUMIN 4.5 08/16/2021 1040   AST 14 08/16/2021 1040   ALT 20 08/16/2021 1040   ALKPHOS 101 08/16/2021 1040   BILITOT 0.3 08/16/2021 1040   BILIDIR 0.06 03/14/2016 0842      Component Value Date/Time   TSH 1.010 07/15/2020 1053   Nutritional Lab Results  Component Value Date   VD25OH 45.3 08/16/2021   VD25OH 116.0 (H) 07/15/2020     ASSESSMENT AND PLAN  TREATMENT PLAN FOR OBESITY:  Recommended Dietary Goals  Kristen Holland is currently in the action stage of change. As such, her goal is to continue weight management plan. She has agreed to the Category 1 Plan.  Which isocaloric considering her indirect calorimetry.  We will schedule a 40-minute appointment to work on establishing nutritional goals as patient has not been  following prescribed nutritional plan and is losing muscle mass.  Behavioral Intervention  We discussed the following Behavioral Modification Strategies today: increasing lean protein intake, decreasing simple carbohydrates , increasing vegetables, avoid skipping meals, increase water intake, decrease liquid calories, work on meal planning and easy cooking plans, and think about ways to increase physical activity.  Additional resources provided today: NA  Recommended Physical Activity Goals  Kristen Holland has been advised to work up to 150 minutes of moderate intensity aerobic activity a week and strengthening exercises 2-3 times per week for cardiovascular health, weight loss maintenance and preservation of muscle mass.   She has agreed to increase physical activity in their day and reduce sedentary time (increase NEAT).    Pharmacotherapy We discussed various medication options to help Kristen Holland with her weight loss efforts and we both agreed to continue [facility] as prescribed by primary care physician.  She has not been taking bupropion which I do not recommend restarting at this time.  ASSOCIATED CONDITIONS ADDRESSED TODAY  Hyperlipidemia associated with type 2 diabetes mellitus (Kristen Holland) Assessment & Plan: LDL is not at goal. Elevated LDL may be secondary to nutrition, genetics and spillover effect from excess adiposity. Recommended LDL goal is less <70 to reduce the future risk of fatty streaks and the progression to obstructive ASCVD in the future. Her 10 year risk is: The 10-year ASCVD risk score (Arnett DK, et al., 2019) is: 5.8%  Lab Results  Component Value Date   CHOL 159 08/16/2021   HDL 52 08/16/2021   LDLCALC 94 08/16/2021   TRIG 69 08/16/2021    Continue weight loss therapy and reducing saturated fats in diet to less than than 10% of daily calories.  Continue statin therapy at current dose.      Diabetes mellitus type 2 in obese Kristen Holland) Assessment & Plan: HgbA1c is at goal for  age and comorbid conditions. Denies symptoms of hypoglycemia or hyperglycemia. On Tirzepatide with good adherence and no side effects.   Counseled on goals of care, monitoring for complications and importance of staying updated on immunizations and diabetes preventive measures.   Lab Results  Component Value Date   HGBA1C 6.1 (H) 08/16/2021   HGBA1C 6.6 (H) 07/15/2020   HGBA1C 5.3 07/15/2012   Lab Results  Component Value Date   LDLCALC 94 08/16/2021   CREATININE 0.82 08/16/2021   Continue with monjauro as prescribed by PCP.  We will initiate reduced calorie balanced nutritional plan at the next office visit since patient was somewhat lost to follow-up.    Essential hypertension Assessment & Plan: Blood pressure at goal for age and risk category.  On hydrochlorothiazide and Avapro.  Without adverse effects.  Most recent renal  parameters reviewed which showed normal electrolytes and kidney function.  Continue with weight loss therapy.  Monitor for symptoms of orthostasis while losing weight. Continue current regimen and home monitoring for a goal blood pressure of 120/80.        Return in about 2 weeks (around 05/02/2022) for For Weight Mangement with Dr. Gerarda Fraction for Nutrition Counseling and Review of plan - 40 minutes.. She was informed of the importance of frequent follow up visits to maximize her success with intensive lifestyle modifications for her multiple health conditions.   ATTESTASTION STATEMENTS:  Reviewed by clinician on day of visit: allergies, medications, problem list, medical history, surgical history, family history, social history, and previous encounter notes.      Thomes Dinning, MD

## 2022-04-18 NOTE — Assessment & Plan Note (Signed)
LDL is not at goal. Elevated LDL may be secondary to nutrition, genetics and spillover effect from excess adiposity. Recommended LDL goal is less <70 to reduce the future risk of fatty streaks and the progression to obstructive ASCVD in the future. Her 10 year risk is: The 10-year ASCVD risk score (Arnett DK, et al., 2019) is: 5.8%  Lab Results  Component Value Date   CHOL 159 08/16/2021   HDL 52 08/16/2021   LDLCALC 94 08/16/2021   TRIG 69 08/16/2021    Continue weight loss therapy and reducing saturated fats in diet to less than than 10% of daily calories.  Continue statin therapy at current dose.

## 2022-04-18 NOTE — Assessment & Plan Note (Addendum)
HgbA1c is at goal for age and comorbid conditions. Denies symptoms of hypoglycemia or hyperglycemia. On Tirzepatide with good adherence and no side effects.   Counseled on goals of care, monitoring for complications and importance of staying updated on immunizations and diabetes preventive measures.   Lab Results  Component Value Date   HGBA1C 6.1 (H) 08/16/2021   HGBA1C 6.6 (H) 07/15/2020   HGBA1C 5.3 07/15/2012   Lab Results  Component Value Date   LDLCALC 94 08/16/2021   CREATININE 0.82 08/16/2021   Continue with monjauro as prescribed by PCP.  We will initiate reduced calorie balanced nutritional plan at the next office visit since patient was somewhat lost to follow-up.

## 2022-05-04 ENCOUNTER — Ambulatory Visit (INDEPENDENT_AMBULATORY_CARE_PROVIDER_SITE_OTHER): Payer: BC Managed Care – PPO | Admitting: Internal Medicine

## 2022-05-04 ENCOUNTER — Encounter (INDEPENDENT_AMBULATORY_CARE_PROVIDER_SITE_OTHER): Payer: Self-pay | Admitting: Internal Medicine

## 2022-05-04 VITALS — BP 99/67 | HR 73 | Temp 97.7°F | Ht 65.0 in | Wt 183.0 lb

## 2022-05-04 DIAGNOSIS — Z6830 Body mass index (BMI) 30.0-30.9, adult: Secondary | ICD-10-CM

## 2022-05-04 DIAGNOSIS — E669 Obesity, unspecified: Secondary | ICD-10-CM

## 2022-05-04 DIAGNOSIS — F3289 Other specified depressive episodes: Secondary | ICD-10-CM | POA: Diagnosis not present

## 2022-05-04 DIAGNOSIS — E1169 Type 2 diabetes mellitus with other specified complication: Secondary | ICD-10-CM

## 2022-05-04 DIAGNOSIS — I10 Essential (primary) hypertension: Secondary | ICD-10-CM

## 2022-05-04 DIAGNOSIS — Z7985 Long-term (current) use of injectable non-insulin antidiabetic drugs: Secondary | ICD-10-CM

## 2022-05-04 DIAGNOSIS — Z7984 Long term (current) use of oral hypoglycemic drugs: Secondary | ICD-10-CM

## 2022-05-04 MED ORDER — BUPROPION HCL ER (SR) 150 MG PO TB12: 150.0000 mg | ORAL_TABLET | Freq: Every day | ORAL | 0 refills | Status: AC

## 2022-05-04 NOTE — Assessment & Plan Note (Signed)
Her blood pressure today is low normal.  She has lost significant amount of weight.  She is currently on hydrochlorothiazide and Avapro and these medications may be able to be reduced.  Patient will monitor for orthostasis and advised to check blood pressures at home.  She will notify PCP if she continues to experience orthostatic dizziness for medication adjustment.  Also counseled on maintaining adequate hydration.

## 2022-05-04 NOTE — Assessment & Plan Note (Signed)
On tirzepatide and metformin without any adverse effects.  She denies any symptoms of high or low blood sugars.  Continue to work on nutritional strategies.  Continue incretin therapy

## 2022-05-04 NOTE — Progress Notes (Signed)
Office: [phone]  /  Fax: [phone]  WEIGHT SUMMARY AND BIOMETRICS  Vitals Temp: 97.7 F (36.5 C) BP: 99/67 Pulse Rate: 73 SpO2: 98 %   Anthropometric Measurements Height: '5\' 5"'$  (1.651 m) Weight: 183 lb (83 kg) BMI (Calculated): 30.45 Weight at Last Visit: 184 lb Weight Lost Since Last Visit: 1 lb Starting Weight: 217 lb Total Weight Loss (lbs): 30 lb (13.6 kg) Peak Weight: 233 lb   Body Composition  Body Fat %: 42.6 % Fat Mass (lbs): 78.2 lbs Muscle Mass (lbs): 100 lbs Total Body Water (lbs): 74 lbs Visceral Fat Rating : 10   Other Clinical Data Fasting: No Labs: No Today's Visit #: 15 Starting Date: 07/15/21    HPI  Chief Complaint: OBESITY  Kristen Holland is here to discuss her progress with her obesity treatment plan. She is on the the Category 1 Plan and states she is following her eating plan approximately 75 % of the time. She states she is exercising 20 minutes 2 times per week.  Interval History:  Since last office visit she has lost 1 pound.  Overall she has lost 36 pounds which is about 23% of body weight.  Her BIA body fat percentage has gone down from 45 to 42%.  She has lost some muscle mass likely due to inadequate protein intake and lack of physical activity. She reports fair.  adherence to prescribed reduced calorie nutrition plan. Has been working on having 3 meals a day Denies problems with appetite and hunger signals.  Denies problems with satiety and satiation.  Reports problems with eating patterns and portion control.  Barriers identified other: Lack of desire to exercise .   Pharmacotherapy for weight loss: She is currently taking no anti-obesity medication and Monjauro with diabetes as the primary indication .   Weight promoting medications identified: None.  ASSESSMENT AND PLAN  TREATMENT PLAN FOR OBESITY:  Recommended Dietary Goals  Kristen Holland is currently in the action stage of change. As such, her goal is to continue weight  management plan. She has agreed to the Category 1 Plan.  Behavioral Intervention  We discussed the following Behavioral Modification Strategies today: increasing lean protein intake, decreasing simple carbohydrates , increasing vegetables, increasing lower sugar fruits, increasing fiber rich foods, avoiding skipping meals, increasing water intake, identifying sources and decreasing liquid calories, work on meal planning and easy cooking plans, decreasing eating out, consumption of processed foods, reading food labels and making healthy choices when eating convenient foods, and emotional eating strategies and understanding the difference between hunger signals and cravings.  Additional resources provided today: NA  Recommended Physical Activity Goals  Kristen Holland has been advised to work up to 150 minutes of moderate intensity aerobic activity a week and strengthening exercises 2-3 times per week for cardiovascular health, weight loss maintenance and preservation of muscle mass.   She has agreed to : Think about ways to increase physical activity.  She has a gym at home advised to look at [employer] and begin strength training doing light weights and chair exercises.  Pharmacotherapy We discussed various medication options to help Kristen Holland with her weight loss efforts and we both agreed to continue incretin therapy.  In addition she would like to restart taking bupropion.  This medication has been prescribed by another physician in the past for cravings.  She found medication was helpful.  She has no contraindications.  She was counseled on medication side effects we refilled medication.  ASSOCIATED CONDITIONS ADDRESSED TODAY  Diabetes mellitus type  2 in obese [facility]) Assessment & Plan: On tirzepatide and metformin without any adverse effects.  She denies any symptoms of high or low blood sugars.  Continue to work on nutritional strategies.  Continue incretin therapy   Other depression, with  emotional eating -     buPROPion HCl ER (SR); Take 1 tablet (150 mg total) by mouth daily.  Dispense: 30 tablet; Refill: 0  Essential hypertension Assessment & Plan: Her blood pressure today is low normal.  She has lost significant amount of weight.  She is currently on hydrochlorothiazide and Avapro and these medications may be able to be reduced.  Patient will monitor for orthostasis and advised to check blood pressures at home.  She will notify PCP if she continues to experience orthostatic dizziness for medication adjustment.  Also counseled on maintaining adequate hydration.      PHYSICAL EXAM:  Blood pressure 99/67, pulse 73, temperature 97.7 F (36.5 C), height '5\' 5"'$  (1.651 m), weight 183 lb (83 kg), SpO2 98 %. Body mass index is 30.45 kg/m.  General: She is overweight, cooperative, alert, well developed, and in no acute distress. PSYCH: Has normal mood, affect and thought process.   HEENT: EOMI, sclerae are anicteric. Lungs: Normal breathing effort, no conversational dyspnea. Extremities: No edema.  Neurologic: No gross sensory or motor deficits. No tremors or fasciculations noted.    DIAGNOSTIC DATA REVIEWED:  BMET    Component Value Date/Time   NA 141 08/16/2021 1040   K 4.5 08/16/2021 1040   CL 99 08/16/2021 1040   CO2 22 08/16/2021 1040   GLUCOSE 95 08/16/2021 1040   GLUCOSE 113 (H) 05/10/2020 1608   BUN 12 08/16/2021 1040   CREATININE 0.82 08/16/2021 1040   CREATININE 0.80 09/24/2013 0958   CALCIUM 10.1 08/16/2021 1040   GFRNONAA >60 05/10/2020 1608   GFRAA >60 03/19/2016 0705   Lab Results  Component Value Date   HGBA1C 6.1 (H) 08/16/2021   HGBA1C 5.3 07/15/2012   Lab Results  Component Value Date   INSULIN 16.9 08/16/2021   INSULIN 12.3 07/15/2020   Lab Results  Component Value Date   TSH 1.010 07/15/2020   CBC    Component Value Date/Time   WBC 8.7 05/10/2020 1644   RBC 4.43 05/10/2020 1644   HGB 12.5 05/10/2020 1644   HCT 39.2 05/10/2020  1644   PLT 372 05/10/2020 1644   MCV 88.5 05/10/2020 1644   MCV 81.2 03/14/2016 0855   MCH 28.2 05/10/2020 1644   MCHC 31.9 05/10/2020 1644   RDW 13.4 05/10/2020 1644   Iron Studies No results found for: "IRON", "TIBC", "FERRITIN", "IRONPCTSAT" Lipid Panel     Component Value Date/Time   CHOL 159 08/16/2021 1040   TRIG 69 08/16/2021 1040   HDL 52 08/16/2021 1040   LDLCALC 94 08/16/2021 1040   Hepatic Function Panel     Component Value Date/Time   PROT 6.8 08/16/2021 1040   ALBUMIN 4.5 08/16/2021 1040   AST 14 08/16/2021 1040   ALT 20 08/16/2021 1040   ALKPHOS 101 08/16/2021 1040   BILITOT 0.3 08/16/2021 1040   BILIDIR 0.06 03/14/2016 0842      Component Value Date/Time   TSH 1.010 07/15/2020 1053   Nutritional Lab Results  Component Value Date   VD25OH 45.3 08/16/2021   VD25OH 116.0 (H) 07/15/2020     Return in about 3 weeks (around 05/25/2022) for For Weight Mangement with Dr. Gerarda Fraction.Marland Kitchen She was informed of the importance of frequent follow up  visits to maximize her success with intensive lifestyle modifications for her multiple health conditions.   ATTESTASTION STATEMENTS:  Reviewed by clinician on day of visit: allergies, medications, problem list, medical history, surgical history, family history, social history, and previous encounter notes.   Time spent on visit including pre-visit chart review and post-visit care and charting was 30 minutes.    Thomes Dinning, MD

## 2022-05-25 ENCOUNTER — Ambulatory Visit (INDEPENDENT_AMBULATORY_CARE_PROVIDER_SITE_OTHER): Payer: BC Managed Care – PPO | Admitting: Internal Medicine

## 2022-06-02 ENCOUNTER — Other Ambulatory Visit (INDEPENDENT_AMBULATORY_CARE_PROVIDER_SITE_OTHER): Payer: Self-pay | Admitting: Internal Medicine

## 2022-06-02 DIAGNOSIS — F3289 Other specified depressive episodes: Secondary | ICD-10-CM

## 2022-07-26 ENCOUNTER — Other Ambulatory Visit (INDEPENDENT_AMBULATORY_CARE_PROVIDER_SITE_OTHER): Payer: Self-pay | Admitting: Bariatrics

## 2022-07-26 DIAGNOSIS — F3289 Other specified depressive episodes: Secondary | ICD-10-CM

## 2022-09-07 ENCOUNTER — Other Ambulatory Visit: Payer: Self-pay | Admitting: Orthopedic Surgery

## 2022-09-07 DIAGNOSIS — M545 Low back pain, unspecified: Secondary | ICD-10-CM

## 2022-09-13 ENCOUNTER — Telehealth: Payer: Self-pay

## 2022-09-13 NOTE — Telephone Encounter (Signed)
Phone call to patient to discuss order for intradiscal injection. Reviewed pts allergies and most recent weight. Pt was advised not to take oral antibiotics for this procedure that we would give her IV antibiotics prior. Discharge instructions also reviewed with patient and instructed patient to have a driver the day of the procedure. Pt verbalized understanding.   

## 2022-09-26 ENCOUNTER — Ambulatory Visit
Admission: RE | Admit: 2022-09-26 | Discharge: 2022-09-26 | Disposition: A | Discharge status: Home / Self Care | Payer: No Typology Code available for payment source | Source: Ambulatory Visit | Attending: Orthopedic Surgery | Admitting: Orthopedic Surgery

## 2022-09-26 DIAGNOSIS — M545 Low back pain, unspecified: Secondary | ICD-10-CM

## 2022-09-26 MED ORDER — IOPAMIDOL (ISOVUE-M 200) INJECTION 41%
1.0000 mL | Freq: Once | INTRAMUSCULAR | Status: AC
  Administered 2022-09-26: 1 mL

## 2022-09-26 MED ORDER — CEFAZOLIN SODIUM-DEXTROSE 2-4 GM/100ML-% IV SOLN
2.0000 g | INTRAVENOUS | Status: AC
  Administered 2022-09-26: 2 g via INTRAVENOUS

## 2022-09-26 NOTE — Discharge Instructions (Signed)
Kyphoplasty Post Procedure Discharge Instructions  May resume a regular diet and any medications that you routinely take (including pain medications). However, if you are taking Aspirin or an anticoagulant/blood thinner you will be told when you can resume taking these by the healthcare provider. No driving day of procedure. The day of your procedure take it easy. You may use an ice pack as needed to injection sites on back.  Ice to back 30 minutes on and 30 minutes off, as needed. May remove bandaids tomorrow after taking a shower. Replace daily with a clean bandaid until healed.  Do not lift anything heavier than a milk jug for 1-2 weeks or determined by your physician.  Follow up with your physician in 2 weeks.    Please contact our office at [phone] for the following symptoms or if you have any questions:  Fever greater than 100 degrees Increased swelling, pain, or redness at injection site. Increased back and/or leg pain New numbness or change in symptoms from before the procedure.   YOU MAY RESUME YOUR ASPIRIN TODAY POST PROCEDURE.  Thank you for visiting [facility].

## 2022-09-29 ENCOUNTER — Other Ambulatory Visit (HOSPITAL_BASED_OUTPATIENT_CLINIC_OR_DEPARTMENT_OTHER): Payer: Self-pay | Admitting: Orthopedic Surgery

## 2022-09-29 ENCOUNTER — Ambulatory Visit (HOSPITAL_BASED_OUTPATIENT_CLINIC_OR_DEPARTMENT_OTHER)
Admission: RE | Admit: 2022-09-29 | Discharge: 2022-09-29 | Disposition: A | Discharge status: Home / Self Care | Payer: No Typology Code available for payment source | Source: Ambulatory Visit | Attending: Orthopedic Surgery | Admitting: Orthopedic Surgery

## 2022-09-29 DIAGNOSIS — M545 Low back pain, unspecified: Secondary | ICD-10-CM

## 2022-10-02 ENCOUNTER — Emergency Department (HOSPITAL_COMMUNITY)
Admission: EM | Admit: 2022-10-02 | Discharge: 2022-10-02 | Disposition: A | Discharge status: Home / Self Care | Payer: BC Managed Care – PPO | Attending: Emergency Medicine | Admitting: Emergency Medicine

## 2022-10-02 ENCOUNTER — Encounter (HOSPITAL_COMMUNITY): Payer: Self-pay | Admitting: Emergency Medicine

## 2022-10-02 ENCOUNTER — Other Ambulatory Visit: Payer: Self-pay

## 2022-10-02 DIAGNOSIS — Z7982 Long term (current) use of aspirin: Secondary | ICD-10-CM | POA: Diagnosis not present

## 2022-10-02 DIAGNOSIS — N764 Abscess of vulva: Secondary | ICD-10-CM | POA: Diagnosis present

## 2022-10-02 DIAGNOSIS — L0291 Cutaneous abscess, unspecified: Secondary | ICD-10-CM

## 2022-10-02 DIAGNOSIS — I10 Essential (primary) hypertension: Secondary | ICD-10-CM | POA: Diagnosis not present

## 2022-10-02 DIAGNOSIS — Z7984 Long term (current) use of oral hypoglycemic drugs: Secondary | ICD-10-CM | POA: Diagnosis not present

## 2022-10-02 DIAGNOSIS — E119 Type 2 diabetes mellitus without complications: Secondary | ICD-10-CM | POA: Diagnosis not present

## 2022-10-02 DIAGNOSIS — Z79899 Other long term (current) drug therapy: Secondary | ICD-10-CM | POA: Insufficient documentation

## 2022-10-02 MED ORDER — IBUPROFEN 600 MG PO TABS: 600.0000 mg | ORAL_TABLET | Freq: Four times a day (QID) | ORAL | 0 refills | Status: AC | PRN

## 2022-10-02 MED ORDER — IBUPROFEN 600 MG PO TABS: 600.0000 mg | ORAL_TABLET | Freq: Four times a day (QID) | ORAL | 0 refills | Status: DC | PRN

## 2022-10-02 MED ORDER — ONDANSETRON HCL 4 MG PO TABS: 4.0000 mg | ORAL_TABLET | Freq: Three times a day (TID) | ORAL | 0 refills | Status: DC | PRN

## 2022-10-02 MED ORDER — CLINDAMYCIN HCL 150 MG PO CAPS: 450.0000 mg | ORAL_CAPSULE | Freq: Three times a day (TID) | ORAL | 0 refills | Status: DC

## 2022-10-02 MED ORDER — ONDANSETRON HCL 4 MG PO TABS: 4.0000 mg | ORAL_TABLET | Freq: Three times a day (TID) | ORAL | 0 refills | Status: AC | PRN

## 2022-10-02 MED ORDER — ACETAMINOPHEN 500 MG PO TABS
1000.0000 mg | ORAL_TABLET | Freq: Once | ORAL | Status: AC
  Administered 2022-10-02: 1000 mg via ORAL
  Filled 2022-10-02: qty 2

## 2022-10-02 MED ORDER — OXYCODONE HCL 5 MG PO TABS
10.0000 mg | ORAL_TABLET | Freq: Once | ORAL | Status: AC
  Administered 2022-10-02: 10 mg via ORAL
  Filled 2022-10-02: qty 2

## 2022-10-02 MED ORDER — OXYCODONE HCL 5 MG PO TABS: 5.0000 mg | ORAL_TABLET | Freq: Four times a day (QID) | ORAL | 0 refills | Status: AC | PRN

## 2022-10-02 MED ORDER — OXYCODONE HCL 5 MG PO TABS: 5.0000 mg | ORAL_TABLET | Freq: Four times a day (QID) | ORAL | 0 refills | Status: DC | PRN

## 2022-10-02 MED ORDER — CLINDAMYCIN HCL 150 MG PO CAPS: 450.0000 mg | ORAL_CAPSULE | Freq: Three times a day (TID) | ORAL | 0 refills | Status: AC

## 2022-10-02 NOTE — Discharge Instructions (Addendum)
Thank you for letting us take care of you today.  I am starting you on antibiotics to treat your abscess. Please start taking these immediately. Use warm compresses at home for 15-20 minutes every few hours. Even if symptoms are improving, complete all of your antibiotics. I prescribed pain medication and nausea medication as needed. I recommend taking the ibuprofen with Tylenol as first line if needed and oxycodone only as needed for breakthrough, severe pain.  If the abscess is not improving in 24-48 hours I recommend getting rechecked by your PCP, urgent care, ED, or by your OB/GYN.  If you develop worsening symptoms including fever, increased redness or swelling to the area, uncontrollable vomiting, or other new, concerning symptoms, return to the nearest ED for reevaluation.

## 2022-10-02 NOTE — ED Triage Notes (Signed)
Pt c/o abscess on vagina since Friday.

## 2022-10-02 NOTE — ED Provider Notes (Addendum)
[facility] EMERG[facility] DE[facility]MENT AT [facility] Provider Note   CSN: [id number] Arrival date & time: 10/02/22  0454     History  Chief Complaint  Patient presents with   Abscess    Kristen Holland is a 57 y.o. female with past medical history hypertension, hyperlipidemia, diabetes who presents to the ED complaining of a "boil" to her vagina for the last 3 days.  States that this has happened before and it was lanced by her PCP a couple of years ago.  At that time she was also on antibiotics.  She has had a couple recent antibiotic courses for unrelated problems but they were completed for this issue began.  1 course was of amoxicillin, second course of an unknown antibiotic.  States that she only has pain surrounding the area and has tried warm compresses at home but not been able to get any drainage return.  She denies abdominal pain, nausea, vomiting, diarrhea, vaginal bleeding or discharge, fever, or other acute concerns today.      Home Medications Prior to Admission medications   Medication Sig Start Date End Date Taking? Authorizing Provider  clindamycin (CLEOCIN) 150 MG capsule Take 3 capsules (450 mg total) by mouth 3 (three) times daily for 7 days. 10/02/22 10/09/22 Yes Kristen Holland L, PA-C  ibuprofen (ADVIL) 600 MG tablet Take 1 tablet (600 mg total) by mouth every 6 (six) hours as needed for up to 5 days for mild pain or moderate pain. 10/02/22 10/07/22 Yes Kristen Holland L, PA-C  ondansetron (ZOFRAN) 4 MG tablet Take 1 tablet (4 mg total) by mouth every 8 (eight) hours as needed for up to 7 days for nausea or vomiting. 10/02/22 10/09/22 Yes Kristen Holland L, PA-C  oxyCODONE (ROXICODONE) 5 MG immediate release tablet Take 1 tablet (5 mg total) by mouth every 6 (six) hours as needed for up to 5 days for severe pain. 10/02/22 10/07/22 Yes Kristen Holland L, PA-C  albuterol (PROVENTIL HFA;VENTOLIN HFA) 108 (90 BASE) MCG/ACT inhaler Inhale 1-2 puffs into the lungs every 6 (six) hours as  needed for wheezing or shortness of breath. Patient taking differently: Inhale 2 puffs into the lungs every 6 (six) hours as needed for wheezing or shortness of breath. 11/21/13   Mellody Drown, PA-C  aspirin EC 81 MG tablet Take by mouth.    [provider]  atorvastatin (LIPITOR) 40 MG tablet Take 40 mg by mouth daily. 10/31/14   [provider]  benzonatate (TESSALON) 100 MG capsule Take 1 capsule (100 mg total) by mouth 3 (three) times daily as needed for cough. 03/06/22   Mickie Bail, NP  buPROPion [facility] SR) 150 MG 12 hr tablet Take 1 tablet (150 mg total) by mouth daily. 05/04/22   Worthy Rancher, MD  dexlansoprazole (DEXILANT) 60 MG capsule Take 60 mg by mouth daily.    [provider]  FLUoxetine (PROZAC) 20 MG capsule Take 20 mg by mouth daily. 03/01/22   [provider]  fluticasone (FLOVENT HFA) 110 MCG/ACT inhaler INHALE 1 PUFF INTO THE LUNGS TWICE A DAY    [provider]  hydrochlorothiazide (HYDRODIURIL) 25 MG tablet TAKE 1 TABLET BY MOUTH EVERY DAY 02/16/18   Croitoru, Mihai, MD  irbesartan (AVAPRO) 300 MG tablet Take 300 mg by mouth daily.    [provider]  LINZESS 290 MCG CAPS capsule 1 cap(s) orally 20 minutes before breakfast for 90 days 01/20/22   [provider]  metFORMIN (GLUCOPHAGE-XR) 500 MG 24  hr tablet Take 500 mg by mouth daily. 03/03/16   [provider]  montelukast (SINGULAIR) 10 MG tablet Take 10 mg by mouth every other day.     [provider]  montelukast (SINGULAIR) 10 MG tablet 1 (one) time each day in the evening.    [provider]  tirzepatide Gregg[facility] Keen) 7.5 MG/0.5ML Pen Inject 7.5 mg into the skin once a week. 11/09/21   Roswell Nickel, DO      Allergies    Patient has no known allergies.    Review of Systems   Review of Systems  All other systems reviewed and are negative.   Physical Exam Updated Vital Signs BP 122/70   Pulse 91   Temp 98.4 F  (36.9 C)   Resp 18   Ht 5\' 5"  (1.651 m)   Wt 83 kg   SpO2 100%   BMI 30.45 kg/m  Physical Exam Vitals and nursing note reviewed. Exam conducted with a chaperone present.  Constitutional:      General: She is not in acute distress.    Appearance: Normal appearance. She is not ill-appearing or toxic-appearing.  HENT:     Head: Normocephalic and atraumatic.     Mouth/Throat:     Mouth: Mucous membranes are moist.  Eyes:     Conjunctiva/sclera: Conjunctivae normal.  Cardiovascular:     Rate and Rhythm: Normal rate and regular rhythm.  Pulmonary:     Effort: Pulmonary effort is normal.     Breath sounds: Normal breath sounds.  Abdominal:     General: Abdomen is flat. There is no distension.     Palpations: Abdomen is soft.     Tenderness: There is no abdominal tenderness. There is no guarding or rebound.  Genitourinary:      Comments: Completed with Kristen Holland, primary RN, as chaperone, large ~4cm in circumference midline exquisitely tender abscess as above partially overlying clitoris with surrounding erythema and fluctuance, no active drainage Musculoskeletal:        General: Normal range of motion.     Cervical back: Neck supple.     Right lower leg: No edema.     Left lower leg: No edema.  Skin:    General: Skin is warm and dry.     Capillary Refill: Capillary refill takes less than 2 seconds.  Neurological:     Mental Status: She is alert. Mental status is at baseline.  Psychiatric:        Behavior: Behavior normal.     ED Results / Procedures / Treatments   Labs (all labs ordered are listed, but only abnormal results are displayed) Labs Reviewed - No data to display  EKG None  Radiology No results found.  Procedures Procedures    Medications Ordered in ED Medications  oxyCODONE (Oxy IR/ROXICODONE) immediate release tablet 10 mg (10 mg Oral Given 10/02/22 0816)  acetaminophen (TYLENOL) tablet 1,000 mg (1,000 mg Oral Given 10/02/22 7829)    ED Course/  Medical Decision Making/ A&P                             Medical Decision Making Risk OTC drugs. Prescription drug management.   Medical Decision Making:   Kristen Holland is a 57 y.o. female who presented to the ED today with abscess detailed above.    Patient's presentation is complicated by their history of HTN, HLD, DM.  Complete initial physical exam performed, notably the patient was  in no acute distress.  Nontoxic-appearing.  She did have a large abscess as noted above with surrounding erythema that was exquisitely tender.    Reviewed and confirmed nursing documentation for past medical history, family history, social history.    Initial Assessment:   With the patient's presentation, differential diagnosis includes but is not limited to abscess, cellulitis, Bartholin's gland cyst, folliculitis. This is most consistent with an acute complicated illness  Initial Plan:  Pain management Objective evaluation as below reviewed   Final Assessment and Plan:   57 year old female presents to the ED complaining of an abscess to her genitalia.  It started 2 days ago but has been getting bigger.  No associated systemic symptoms.  Patient nontoxic-appearing.  Vital signs reassuring.  Afebrile, no tachycardia.  She does have a fairly large approximately 4 cm abscess to the midline overlying the clitoris as above.  Notes that previously when she had an abscess that was drained by her PCP it was more lateral. She has been doing warm compresses at home but no antibiotics since start of these symptoms. With location, hesitant to lance and pt agreeable to adhere to strict antibiotic regimen and warm compresses at home to trial for improvement first. Offered drainage but considering discomfort and location pt would prefer to trial antibiotics first as well. Prescription for Clindamycin given to cover both strep and staph. Pt instructed if no improvement in 24-48 hours or for acutely worsening symptoms to be  rechecked/return and agreeable to do so. PDMP reviewed and negative for recent prescriptions. Given sensitivity of area, will provide with pain medication for home. Strict ED return precautions given, all questions answered, and stable for discharge.    Clinical Impression:  1. Abscess      Discharge          Final Clinical Impression(s) / ED Diagnoses Final diagnoses:  Abscess    Rx / DC Orders ED Discharge Orders          Ordered    clindamycin (CLEOCIN) 150 MG capsule  3 times daily        10/02/22 0813    ondansetron (ZOFRAN) 4 MG tablet  Every 8 hours PRN        10/02/22 0813    oxyCODONE (ROXICODONE) 5 MG immediate release tablet  Every 6 hours PRN        10/02/22 0815    ibuprofen (ADVIL) 600 MG tablet  Every 6 hours PRN        10/02/22 0815              Tonette Lederer, PA-C 10/02/22 0826    Tonette Lederer, PA-C 10/02/22 6045    Laurence Spates, MD 10/02/22 [phone]

## 2022-10-26 ENCOUNTER — Other Ambulatory Visit: Payer: Self-pay | Admitting: Orthopedic Surgery

## 2022-11-04 NOTE — Pre-Procedure Instructions (Signed)
Surgical Instructions   Your procedure is scheduled on November 16, 2022. Report to [facility] Main Entrance "A" at 5:30 A.M., then check in with the Admitting office. Any questions or running late day of surgery: call [phone]  Questions prior to your surgery date: call [phone], Monday-Friday, 8am-4pm. If you experience any cold or flu symptoms such as cough, fever, chills, shortness of breath, etc. between now and your scheduled surgery, please notify us at the above number.     Remember:  Do not eat after midnight the night before your surgery  You may drink clear liquids until 4:30 AM the morning of your surgery.   Clear liquids allowed are: Water, Non-Citrus Juices (without pulp), Carbonated Beverages, Clear Tea, Black Coffee Only (NO MILK, CREAM OR POWDERED CREAMER of any kind), and Gatorade.  Patient Instructions  The night before surgery:  No food after midnight. ONLY clear liquids after midnight  The day of surgery (if you have diabetes): Drink ONE (1) 12 oz G2 given to you in your pre admission testing appointment by 4:30 AM the morning of surgery. Drink in one sitting. Do not sip.  This drink was given to you during your hospital  pre-op appointment visit.  Nothing else to drink after completing the  12 oz bottle of G2.         If you have questions, please contact your surgeon's office.     Take these medicines the morning of surgery with A SIP OF WATER: atorvastatin (LIPITOR)  FLUoxetine (PROZAC)  pantoprazole (PROTONIX)  Tenapanor HCl (IBSRELA)  XIIDRA eye drops   May take these medicines IF NEEDED: albuterol (PROVENTIL HFA;VENTOLIN HFA) inhaler - please bring inhaler fluticasone (FLOVENT HFA) inhaler - please bring inhaler methocarbamol (ROBAXIN)    One week prior to surgery, STOP taking any Aspirin (unless otherwise instructed by your surgeon) Aleve, Naproxen, Ibuprofen, Motrin, Advil, Goody's, BC's, all herbal medications, fish oil, and  non-prescription vitamins.   WHAT DO I DO ABOUT MY DIABETES MEDICATION?   Do not take metFORMIN (GLUCOPHAGE-XR) the morning of surgery.  STOP taking tirzepatide [facility]) one week prior to surgery.   HOW TO MANAGE YOUR DIABETES BEFORE AND AFTER SURGERY  Why is it important to control my blood sugar before and after surgery? Improving blood sugar levels before and after surgery helps healing and can limit problems. A way of improving blood sugar control is eating a healthy diet by:  Eating less sugar and carbohydrates  Increasing activity/exercise  Talking with your doctor about reaching your blood sugar goals High blood sugars (greater than 180 mg/dL) can raise your risk of infections and slow your recovery, so you will need to focus on controlling your diabetes during the weeks before surgery. Make sure that the doctor who takes care of your diabetes knows about your planned surgery including the date and location.  How do I manage my blood sugar before surgery? Check your blood sugar at least 4 times a day, starting 2 days before surgery, to make sure that the level is not too high or low.  Check your blood sugar the morning of your surgery when you wake up and every 2 hours until you get to the Short Stay unit.  If your blood sugar is less than 70 mg/dL, you will need to treat for low blood sugar: Do not take insulin. Treat a low blood sugar (less than 70 mg/dL) with  cup of clear juice (cranberry or apple), 4 glucose tablets, OR glucose gel. Recheck  blood sugar in 15 minutes after treatment (to make sure it is greater than 70 mg/dL). If your blood sugar is not greater than 70 mg/dL on recheck, call [phone] for further instructions. Report your blood sugar to the short stay nurse when you get to Short Stay.  If you are admitted to the hospital after surgery: Your blood sugar will be checked by the staff and you will probably be given insulin after surgery (instead of oral  diabetes medicines) to make sure you have good blood sugar levels. The goal for blood sugar control after surgery is 80-180 mg/dL.                      Do NOT Smoke (Tobacco/Vaping) for 24 hours prior to your procedure.  If you use a CPAP at night, you may bring your mask/headgear for your overnight stay.   You will be asked to remove any contacts, glasses, piercing's, hearing aid's, dentures/partials prior to surgery. Please bring cases for these items if needed.    Patients discharged the day of surgery will not be allowed to drive home, and someone needs to stay with them for 24 hours.  SURGICAL WAITING ROOM VISITATION Patients may have no more than 2 support people in the waiting area - these visitors may rotate.   Pre-op nurse will coordinate an appropriate time for 1 ADULT support person, who may not rotate, to accompany patient in pre-op.  Children under the age of 50 must have an adult with them who is not the patient and must remain in the main waiting area with an adult.  If the patient needs to stay at the hospital during part of their recovery, the visitor guidelines for inpatient rooms apply.  Please refer to the [facility] website for the visitor guidelines for any additional information.   If you received a COVID test during your pre-op visit  it is requested that you wear a mask when out in public, stay away from anyone that may not be feeling well and notify your surgeon if you develop symptoms. If you have been in contact with anyone that has tested positive in the last 10 days please notify you surgeon.      Pre-operative 5 CHG Bathing Instructions   You can play a key role in reducing the risk of infection after surgery. Your skin needs to be as free of germs as possible. You can reduce the number of germs on your skin by washing with CHG (chlorhexidine gluconate) soap before surgery. CHG is an antiseptic soap that kills germs and continues to kill germs even after  washing.   DO NOT use if you have an allergy to chlorhexidine/CHG or antibacterial soaps. If your skin becomes reddened or irritated, stop using the CHG and notify one of our RNs at [phone].   Please shower with the CHG soap starting 4 days before surgery using the following schedule:     Please keep in mind the following:  DO NOT shave, including legs and underarms, starting the day of your first shower.   You may shave your face at any point before/day of surgery.  Place clean sheets on your bed the day you start using CHG soap. Use a clean washcloth (not used since being washed) for each shower. DO NOT sleep with pets once you start using the CHG.   CHG Shower Instructions:  If you choose to wash your hair and private area, wash first with your normal  shampoo/soap.  After you use shampoo/soap, rinse your hair and body thoroughly to remove shampoo/soap residue.  Turn the water OFF and apply about 3 tablespoons (45 ml) of CHG soap to a CLEAN washcloth.  Apply CHG soap ONLY FROM YOUR NECK DOWN TO YOUR TOES (washing for 3-5 minutes)  DO NOT use CHG soap on face, private areas, open wounds, or sores.  Pay special attention to the area where your surgery is being performed.  If you are having back surgery, having someone wash your back for you may be helpful. Wait 2 minutes after CHG soap is applied, then you may rinse off the CHG soap.  Pat dry with a clean towel  Put on clean clothes/pajamas   If you choose to wear lotion, please use ONLY the CHG-compatible lotions on the back of this paper.   Additional instructions for the day of surgery: DO NOT APPLY any lotions, deodorants, cologne, or perfumes.   Do not bring valuables to the hospital. [facility] is not responsible for any belongings/valuables. Do not wear nail polish, gel polish, artificial nails, or any other type of covering on natural nails (fingers and toes) Do not wear jewelry or makeup Put on clean/comfortable  clothes.  Please brush your teeth.  Ask your nurse before applying any prescription medications to the skin.     CHG Compatible Lotions   Aveeno Moisturizing lotion  Cetaphil Moisturizing Cream  Cetaphil Moisturizing Lotion  Clairol Herbal Essence Moisturizing Lotion, Dry Skin  Clairol Herbal Essence Moisturizing Lotion, Extra Dry Skin  Clairol Herbal Essence Moisturizing Lotion, Normal Skin  Curel Age Defying Therapeutic Moisturizing Lotion with Alpha Hydroxy  Curel Extreme Care Body Lotion  Curel Soothing Hands Moisturizing Hand Lotion  Curel Therapeutic Moisturizing Cream, Fragrance-Free  Curel Therapeutic Moisturizing Lotion, Fragrance-Free  Curel Therapeutic Moisturizing Lotion, Original Formula  Eucerin Daily Replenishing Lotion  Eucerin Dry Skin Therapy Plus Alpha Hydroxy Crme  Eucerin Dry Skin Therapy Plus Alpha Hydroxy Lotion  Eucerin Original Crme  Eucerin Original Lotion  Eucerin Plus Crme Eucerin Plus Lotion  Eucerin TriLipid Replenishing Lotion  Keri Anti-Bacterial Hand Lotion  Keri Deep Conditioning Original Lotion Dry Skin Formula Softly Scented  Keri Deep Conditioning Original Lotion, Fragrance Free Sensitive Skin Formula  Keri Lotion Fast Absorbing Fragrance Free Sensitive Skin Formula  Keri Lotion Fast Absorbing Softly Scented Dry Skin Formula  Keri Original Lotion  Keri Skin Renewal Lotion Keri Silky Smooth Lotion  Keri Silky Smooth Sensitive Skin Lotion  Nivea Body Creamy Conditioning Oil  Nivea Body Extra Enriched Lotion  Nivea Body Original Lotion  Nivea Body Sheer Moisturizing Lotion Nivea Crme  Nivea Skin Firming Lotion  NutraDerm 30 Skin Lotion  NutraDerm Skin Lotion  NutraDerm Therapeutic Skin Cream  NutraDerm Therapeutic Skin Lotion  ProShield Protective Hand Cream  Provon moisturizing lotion  Please read over the following fact sheets that you were given.

## 2022-11-08 ENCOUNTER — Other Ambulatory Visit: Payer: Self-pay

## 2022-11-08 ENCOUNTER — Encounter (HOSPITAL_COMMUNITY)
Admission: RE | Admit: 2022-11-08 | Discharge: 2022-11-08 | Disposition: A | Discharge status: Home / Self Care | Payer: No Typology Code available for payment source | Source: Ambulatory Visit | Attending: Orthopedic Surgery | Admitting: Orthopedic Surgery

## 2022-11-08 ENCOUNTER — Encounter (HOSPITAL_COMMUNITY): Payer: Self-pay

## 2022-11-08 VITALS — BP 114/61 | HR 73 | Temp 98.0°F | Resp 17 | Ht 65.0 in | Wt 183.0 lb

## 2022-11-08 DIAGNOSIS — Z01818 Encounter for other preprocedural examination: Secondary | ICD-10-CM | POA: Diagnosis present

## 2022-11-08 HISTORY — DX: Pneumonia, unspecified organism: J18.9

## 2022-11-08 HISTORY — DX: Anxiety disorder, unspecified: F41.9

## 2022-11-08 LAB — COMPREHENSIVE METABOLIC PANEL
ALT: 16 U/L (ref 0–44)
AST: 15 U/L (ref 15–41)
Albumin: 3.6 g/dL (ref 3.5–5.0)
Alkaline Phosphatase: 59 U/L (ref 38–126)
Anion gap: 10 (ref 5–15)
BUN: 10 mg/dL (ref 6–20)
CO2: 26 mmol/L (ref 22–32)
Calcium: 9.3 mg/dL (ref 8.9–10.3)
Chloride: 103 mmol/L (ref 98–111)
Creatinine, Ser: 0.93 mg/dL (ref 0.44–1.00)
GFR, Estimated: >60 mL/min (ref >60)
Glucose, Bld: 90 mg/dL (ref 70–99)
Potassium: 4.2 mmol/L (ref 3.5–5.1)
Sodium: 139 mmol/L (ref 135–145)
Total Bilirubin: 0.6 mg/dL (ref 0.3–1.2)
Total Protein: 6.5 g/dL (ref 6.5–8.1)

## 2022-11-08 LAB — CBC
HCT: 39.7 % (ref 36.0–46.0)
Hemoglobin: 12.7 g/dL (ref 12.0–15.0)
MCH: 28.1 pg (ref 26.0–34.0)
MCHC: 32 g/dL (ref 30.0–36.0)
MCV: 87.8 fL (ref 80.0–100.0)
Platelets: 349 10*3/uL (ref 150–400)
RBC: 4.52 MIL/uL (ref 3.87–5.11)
RDW: 12.9 % (ref 11.5–15.5)
WBC: 8.5 10*3/uL (ref 4.0–10.5)
nRBC: 0 % (ref 0.0–0.2)

## 2022-11-08 LAB — TYPE AND SCREEN
ABO/RH(D): B POS
Antibody Screen: NEGATIVE

## 2022-11-08 LAB — SURGICAL PCR SCREEN
MRSA, PCR: NEGATIVE
Staphylococcus aureus: NEGATIVE

## 2022-11-08 LAB — GLUCOSE, CAPILLARY: Glucose-Capillary: 88 mg/dL (ref 70–99)

## 2022-11-08 LAB — HEMOGLOBIN A1C
Hgb A1c MFr Bld: 5.3 % (ref 4.8–5.6)
Mean Plasma Glucose: 105.41 mg/dL

## 2022-11-08 NOTE — Progress Notes (Signed)
PCP - Dr Worthy Rancher Cardiologist - none  Chest x-ray - n/a EKG - 11/08/22 Stress Test - 07/04/13 ECHO - 06/01/17 Cardiac Cath - n/a  ICD Pacemaker/Loop - n/a  Sleep Study -  Yes CPAP - uses CPAP  Diabetes Type 2 Do not take Metformin on the morning of surgery.  If your blood sugar is less than 70 mg/dL, you will need to treat for low blood sugar: Treat a low blood sugar (less than 70 mg/dL) with  cup of clear juice (cranberry or apple), 4 glucose tablets, OR glucose gel. Recheck blood sugar in 15 minutes after treatment (to make sure it is greater than 70 mg/dL). If your blood sugar is not greater than 70 mg/dL on recheck, call [phone] for further instructions.  ERAS: Clear liquids til 5:30 AM DOS.  Please complete your PRE-SURGERY G2 that was provided to you by 5:30 AM the morning of surgery.  Please, if able, drink it in one setting. DO NOT SIP.  This will be the last thing that you will drink.  Anesthesia review: Yes  STOP now taking any Aspirin (unless otherwise instructed by your surgeon), Aleve, Naproxen, Ibuprofen, Motrin, Advil, Goody's, BC's, all herbal medications, fish oil, and all vitamins.   Coronavirus Screening Do you have any of the following symptoms:  Cough yes/no: No Fever (>100.32F)  yes/no: No Runny nose yes/no: No Sore throat yes/no: No Difficulty breathing/shortness of breath  yes/no: No  Have you traveled in the last 14 days and where? yes/no: No  Patient verbalized understanding of instructions that were given to them at the PAT appointment. Patient was also instructed that they will need to review over the PAT instructions again at home before surgery.

## 2022-11-09 NOTE — Progress Notes (Signed)
Anesthesia Chart Review:  Case: [id number] Date/Time: 11/16/22 0815   Procedure: LEFT-SIDED LUMBAR 5 - SACRUM 1 TRANSFORAMINAL LUMBAR INTERBODY FUSION AND DECOMPRESSION WITH INSTRUMENTATION AND ALLOGRAFT (Left)   Anesthesia type: General   Pre-op diagnosis: DEGENERATIVE DISC DISEASE   Location: [facility] OR ROOM 05 / [facility] OR   Surgeons: Estill Bamberg, MD       DISCUSSION: Patient is a 57 year old female scheduled for the above procedure.  History includes never smoker, HTN, HLD, DM2, GERD, asthma, OSA (uses CPAP), fatty liver, anxiety, hysterectomy, spinal surgery (right L5-S1 diskectomy 10/22/03), appendectomy (04/13/00).  Preoperative EKG and labs were normal. Normal stress echo in 2019. A1c 5.3%. Last Mounjaro dose dose recorded as 11/08/22, and given instructions to hold for one week prior to surgery.   Anesthesia team to evaluate on the day of surgery.    VS: BP 114/61   Pulse 73   Temp 36.7 C   Resp 17   Ht 5\' 5"  (1.651 m)   Wt 83 kg   SpO2 100%   BMI 30.45 kg/m   PROVIDERS: Milus Height, PA is listed as PCP Worthy Rancher, MD is [facility] provider - Croitoru, Mihai, MD on 06/01/17 for atypical chest pain, SOB and HTN. 06/01/17 echo/stress echo showed normal stress echo, mild LVH, LVEF 65-70%, mild MR, trivial TR. He suspected dyspnea would improve with restarting diuretic and improved BP control.    LABS: Labs reviewed: Acceptable for surgery. (all labs ordered are listed, but only abnormal results are displayed)  Labs Reviewed  SURGICAL PCR SCREEN  GLUCOSE, CAPILLARY  CBC  COMPREHENSIVE METABOLIC PANEL  HEMOGLOBIN A1C  TYPE AND SCREEN     IMAGES: MRI L-spine 09/28/22: IMPRESSION: 1. Lumbar spine spondylosis as described above. No significant interval change compared with the prior exam. 2. No acute osseous injury of the lumbar spine.   EKG: 11/08/22: NSR   CV: Echo 06/01/17: Study Conclusions  - Left ventricle: The cavity size was normal. Wall  thickness was    increased in a pattern of mild LVH. Systolic function was    vigorous. The estimated ejection fraction was in the range of 65%    to 70%.  - Mitral valve: There was mild regurgitation.   Stress echo 06/01/17: Impressions:  - Normal study after maximal exercise.   Nuclear stress test 07/02/13: Overall Impression:  Normal stress nuclear study. LV Ejection Fraction: 64%.  LV Wall Motion:  NL LV Function; NL Wall Motion   Past Medical History:  Diagnosis Date   Abdominal pain    Allergy    Anxiety    Arthritis    Asthma    Back pain    Constipation    Depression    Diabetes mellitus without complication (HCC)    type 2   Fatty liver    Gallbladder problem    GERD (gastroesophageal reflux disease)    Hyperlipidemia    Hypertension    Joint pain    Nausea & vomiting    Pneumonia    x several   Seasonal allergies    Sleep apnea    uses CPAP   Vitamin D deficiency     Past Surgical History:  Procedure Laterality Date   ABDOMINAL HYSTERECTOMY     APPENDECTOMY     BACK SURGERY     WRIST SURGERY Left    tendon repair - at [facility]    MEDICATIONS:  tirzepatide (MOUNJARO) 7.5 MG/0.5ML Pen   albuterol (PROVENTIL HFA;VENTOLIN  HFA) 108 (90 BASE) MCG/ACT inhaler   atorvastatin (LIPITOR) 40 MG tablet   benzonatate (TESSALON) 100 MG capsule   buPROPion (WELLBUTRIN SR) 150 MG 12 hr tablet   FLUoxetine (PROZAC) 20 MG capsule   fluticasone (FLOVENT HFA) 110 MCG/ACT inhaler   hydrochlorothiazide (HYDRODIURIL) 25 MG tablet   irbesartan (AVAPRO) 300 MG tablet   metFORMIN (GLUCOPHAGE-XR) 500 MG 24 hr tablet   methocarbamol (ROBAXIN) 500 MG tablet   montelukast (SINGULAIR) 10 MG tablet   pantoprazole (PROTONIX) 40 MG tablet   Tenapanor HCl (IBSRELA) 50 MG TABS   XIIDRA 5 % SOLN   No current facility-administered medications for this encounter.    Shonna Chock, PA-C Surgical Short Stay/Anesthesiology [facility] Phone [phone] [facility] Phone [phone] 11/09/2022 2:43 PM

## 2022-11-09 NOTE — Anesthesia Preprocedure Evaluation (Addendum)
Anesthesia Evaluation  Patient identified by MRN, date of birth, ID band Patient awake    Reviewed: Allergy & Precautions, H&P , NPO status , Patient's Chart, lab work & pertinent test results  Airway Mallampati: II  TM Distance: >3 FB Neck ROM: Full    Dental no notable dental hx.    Pulmonary asthma , sleep apnea    Pulmonary exam normal breath sounds clear to auscultation       Cardiovascular hypertension, Pt. on medications negative cardio ROS Normal cardiovascular exam Rhythm:Regular Rate:Normal     Neuro/Psych   Anxiety Depression    negative neurological ROS  negative psych ROS   GI/Hepatic Neg liver ROS,GERD  ,,  Endo/Other  negative endocrine ROSdiabetes, Type 2    Renal/GU negative Renal ROS  negative genitourinary   Musculoskeletal  (+) Arthritis , Osteoarthritis,    Abdominal   Peds negative pediatric ROS (+)  Hematology negative hematology ROS (+)   Anesthesia Other Findings   Reproductive/Obstetrics negative OB ROS                             Anesthesia Physical Anesthesia Plan  ASA: 3  Anesthesia Plan: General   Post-op Pain Management: Dilaudid IV   Induction: Intravenous  PONV Risk Score and Plan: 3 and Ondansetron, Dexamethasone, Midazolam and Treatment may vary due to age or medical condition  Airway Management Planned: Oral ETT  Additional Equipment:   Intra-op Plan:   Post-operative Plan: Extubation in OR  Informed Consent: I have reviewed the patients History and Physical, chart, labs and discussed the procedure including the risks, benefits and alternatives for the proposed anesthesia with the patient or authorized representative who has indicated his/her understanding and acceptance.     Dental advisory given  Plan Discussed with: CRNA  Anesthesia Plan Comments: (PAT note written 11/09/2022 by Shonna Chock, PA-C.  )       Anesthesia  Quick Evaluation

## 2022-11-16 ENCOUNTER — Inpatient Hospital Stay (HOSPITAL_COMMUNITY)
Admission: RE | Admit: 2022-11-16 | Discharge: 2022-11-17 | DRG: 455 | Disposition: A | Discharge status: Home / Self Care | Payer: No Typology Code available for payment source | Attending: Orthopedic Surgery | Admitting: Orthopedic Surgery

## 2022-11-16 ENCOUNTER — Ambulatory Visit (HOSPITAL_COMMUNITY): Payer: No Typology Code available for payment source | Admitting: Anesthesiology

## 2022-11-16 ENCOUNTER — Ambulatory Visit (HOSPITAL_COMMUNITY): Payer: BC Managed Care – PPO

## 2022-11-16 ENCOUNTER — Other Ambulatory Visit: Payer: Self-pay

## 2022-11-16 ENCOUNTER — Encounter (HOSPITAL_COMMUNITY): Payer: Self-pay | Admitting: Orthopedic Surgery

## 2022-11-16 ENCOUNTER — Encounter (HOSPITAL_COMMUNITY)
Admission: RE | Disposition: A | Discharge status: Home / Self Care | Payer: Self-pay | Source: Home / Self Care | Attending: Orthopedic Surgery

## 2022-11-16 ENCOUNTER — Ambulatory Visit (HOSPITAL_COMMUNITY): Payer: No Typology Code available for payment source | Admitting: Vascular Surgery

## 2022-11-16 DIAGNOSIS — M5137 Other intervertebral disc degeneration, lumbosacral region: Secondary | ICD-10-CM | POA: Diagnosis present

## 2022-11-16 DIAGNOSIS — Z8249 Family history of ischemic heart disease and other diseases of the circulatory system: Secondary | ICD-10-CM | POA: Diagnosis not present

## 2022-11-16 DIAGNOSIS — Z7984 Long term (current) use of oral hypoglycemic drugs: Secondary | ICD-10-CM | POA: Diagnosis not present

## 2022-11-16 DIAGNOSIS — Z79899 Other long term (current) drug therapy: Secondary | ICD-10-CM

## 2022-11-16 DIAGNOSIS — Z833 Family history of diabetes mellitus: Secondary | ICD-10-CM | POA: Diagnosis not present

## 2022-11-16 DIAGNOSIS — Z6372 Alcoholism and drug addiction in family: Secondary | ICD-10-CM

## 2022-11-16 DIAGNOSIS — Z7951 Long term (current) use of inhaled steroids: Secondary | ICD-10-CM | POA: Diagnosis not present

## 2022-11-16 DIAGNOSIS — F32A Depression, unspecified: Secondary | ICD-10-CM | POA: Diagnosis present

## 2022-11-16 DIAGNOSIS — J45909 Unspecified asthma, uncomplicated: Secondary | ICD-10-CM | POA: Diagnosis present

## 2022-11-16 DIAGNOSIS — E785 Hyperlipidemia, unspecified: Secondary | ICD-10-CM | POA: Diagnosis present

## 2022-11-16 DIAGNOSIS — Z818 Family history of other mental and behavioral disorders: Secondary | ICD-10-CM

## 2022-11-16 DIAGNOSIS — I10 Essential (primary) hypertension: Secondary | ICD-10-CM | POA: Diagnosis present

## 2022-11-16 DIAGNOSIS — M5136 Other intervertebral disc degeneration, lumbar region: Secondary | ICD-10-CM

## 2022-11-16 DIAGNOSIS — E119 Type 2 diabetes mellitus without complications: Secondary | ICD-10-CM | POA: Diagnosis present

## 2022-11-16 DIAGNOSIS — K760 Fatty (change of) liver, not elsewhere classified: Secondary | ICD-10-CM | POA: Diagnosis present

## 2022-11-16 DIAGNOSIS — M5416 Radiculopathy, lumbar region: Secondary | ICD-10-CM | POA: Diagnosis present

## 2022-11-16 DIAGNOSIS — G4730 Sleep apnea, unspecified: Secondary | ICD-10-CM | POA: Diagnosis present

## 2022-11-16 DIAGNOSIS — Z809 Family history of malignant neoplasm, unspecified: Secondary | ICD-10-CM

## 2022-11-16 DIAGNOSIS — Z841 Family history of disorders of kidney and ureter: Secondary | ICD-10-CM | POA: Diagnosis not present

## 2022-11-16 DIAGNOSIS — Z7985 Long-term (current) use of injectable non-insulin antidiabetic drugs: Secondary | ICD-10-CM | POA: Diagnosis not present

## 2022-11-16 DIAGNOSIS — K219 Gastro-esophageal reflux disease without esophagitis: Secondary | ICD-10-CM | POA: Diagnosis present

## 2022-11-16 HISTORY — PX: TRANSFORAMINAL LUMBAR INTERBODY FUSION (TLIF) WITH PEDICLE SCREW FIXATION 1 LEVEL: SHX6141

## 2022-11-16 LAB — ABO/RH: ABO/RH(D): B POS

## 2022-11-16 LAB — GLUCOSE, CAPILLARY
Glucose-Capillary: 83 mg/dL (ref 70–99)
Glucose-Capillary: 89 mg/dL (ref 70–99)
Glucose-Capillary: 190 mg/dL — ABNORMAL HIGH (ref 70–99)
Glucose-Capillary: 136 mg/dL — ABNORMAL HIGH (ref 70–99)

## 2022-11-16 SURGERY — TRANSFORAMINAL LUMBAR INTERBODY FUSION (TLIF) WITH PEDICLE SCREW FIXATION 1 LEVEL
Anesthesia: General | Site: Spine Lumbar | Laterality: Left

## 2022-11-16 MED ORDER — MENTHOL 3 MG MT LOZG: 1.0000 | LOZENGE | OROMUCOSAL | Status: DC | PRN

## 2022-11-16 MED ORDER — BUPIVACAINE LIPOSOME 1.3 % IJ SUSP
INTRAMUSCULAR | Status: DC | PRN
  Administered 2022-11-16: 20 mL

## 2022-11-16 MED ORDER — ROCURONIUM BROMIDE 10 MG/ML (PF) SYRINGE
PREFILLED_SYRINGE | INTRAVENOUS | Status: AC
  Filled 2022-11-16: qty 10

## 2022-11-16 MED ORDER — LACTATED RINGERS IV SOLN
INTRAVENOUS | Status: DC | PRN
Start: 2022-11-16 — End: 2022-11-16

## 2022-11-16 MED ORDER — ACETAMINOPHEN 325 MG PO TABS: 650.0000 mg | ORAL_TABLET | ORAL | Status: DC | PRN

## 2022-11-16 MED ORDER — CEFAZOLIN SODIUM-DEXTROSE 2-4 GM/100ML-% IV SOLN
2.0000 g | INTRAVENOUS | Status: AC
  Administered 2022-11-16: 2 g via INTRAVENOUS
  Filled 2022-11-16: qty 100

## 2022-11-16 MED ORDER — MIDAZOLAM HCL 2 MG/2ML IJ SOLN
INTRAMUSCULAR | Status: AC
  Filled 2022-11-16: qty 2

## 2022-11-16 MED ORDER — PHENOL 1.4 % MT LIQD: 1.0000 | OROMUCOSAL | Status: DC | PRN

## 2022-11-16 MED ORDER — SUGAMMADEX SODIUM 200 MG/2ML IV SOLN
INTRAVENOUS | Status: DC | PRN
  Administered 2022-11-16: 200 mg via INTRAVENOUS

## 2022-11-16 MED ORDER — ONDANSETRON HCL 4 MG/2ML IJ SOLN
INTRAMUSCULAR | Status: DC | PRN
  Administered 2022-11-16: 4 mg via INTRAVENOUS

## 2022-11-16 MED ORDER — MEPERIDINE HCL 25 MG/ML IJ SOLN: 6.2500 mg | INTRAMUSCULAR | Status: DC | PRN

## 2022-11-16 MED ORDER — SODIUM CHLORIDE 0.9% FLUSH: 3.0000 mL | INTRAVENOUS | Status: DC | PRN

## 2022-11-16 MED ORDER — LIDOCAINE 2% (20 MG/ML) 5 ML SYRINGE
INTRAMUSCULAR | Status: AC
  Filled 2022-11-16: qty 5

## 2022-11-16 MED ORDER — METHOCARBAMOL 500 MG PO TABS: 500.0000 mg | ORAL_TABLET | Freq: Four times a day (QID) | ORAL | 2 refills | Status: AC | PRN

## 2022-11-16 MED ORDER — OXYCODONE-ACETAMINOPHEN 5-325 MG PO TABS
1.0000 | ORAL_TABLET | ORAL | Status: DC | PRN
  Administered 2022-11-16 – 2022-11-17 (×6): 2 via ORAL
  Filled 2022-11-16 (×6): qty 2

## 2022-11-16 MED ORDER — LACTATED RINGERS IV SOLN: INTRAVENOUS | Status: DC

## 2022-11-16 MED ORDER — SODIUM CHLORIDE 0.9% FLUSH
3.0000 mL | Freq: Two times a day (BID) | INTRAVENOUS | Status: DC
  Administered 2022-11-16 (×2): 3 mL via INTRAVENOUS

## 2022-11-16 MED ORDER — OXYCODONE HCL 5 MG PO TABS
ORAL_TABLET | ORAL | Status: AC
  Filled 2022-11-16: qty 1

## 2022-11-16 MED ORDER — POVIDONE-IODINE 7.5 % EX SOLN
Freq: Once | CUTANEOUS | Status: DC
  Filled 2022-11-16: qty 118

## 2022-11-16 MED ORDER — ALBUTEROL SULFATE (2.5 MG/3ML) 0.083% IN NEBU: 3.0000 mL | INHALATION_SOLUTION | Freq: Four times a day (QID) | RESPIRATORY_TRACT | Status: DC | PRN

## 2022-11-16 MED ORDER — ALUM & MAG HYDROXIDE-SIMETH 200-200-20 MG/5ML PO SUSP: 30.0000 mL | Freq: Four times a day (QID) | ORAL | Status: DC | PRN

## 2022-11-16 MED ORDER — IRBESARTAN 150 MG PO TABS
300.0000 mg | ORAL_TABLET | Freq: Every day | ORAL | Status: DC
  Administered 2022-11-17: 300 mg via ORAL
  Filled 2022-11-16: qty 2

## 2022-11-16 MED ORDER — METFORMIN HCL ER 500 MG PO TB24
500.0000 mg | ORAL_TABLET | Freq: Every day | ORAL | Status: DC
  Administered 2022-11-17: 500 mg via ORAL
  Filled 2022-11-16: qty 1

## 2022-11-16 MED ORDER — MORPHINE SULFATE (PF) 2 MG/ML IV SOLN
1.0000 mg | INTRAVENOUS | Status: DC | PRN
  Administered 2022-11-16: 2 mg via INTRAVENOUS
  Filled 2022-11-16: qty 1

## 2022-11-16 MED ORDER — ONDANSETRON HCL 4 MG/2ML IJ SOLN: 4.0000 mg | Freq: Four times a day (QID) | INTRAMUSCULAR | Status: DC | PRN

## 2022-11-16 MED ORDER — ONDANSETRON HCL 4 MG/2ML IJ SOLN
INTRAMUSCULAR | Status: AC
  Filled 2022-11-16: qty 2

## 2022-11-16 MED ORDER — BUPIVACAINE-EPINEPHRINE 0.25% -1:200000 IJ SOLN
INTRAMUSCULAR | Status: DC | PRN
  Administered 2022-11-16: 10 mL

## 2022-11-16 MED ORDER — ATORVASTATIN CALCIUM 40 MG PO TABS
40.0000 mg | ORAL_TABLET | Freq: Every day | ORAL | Status: DC
  Administered 2022-11-16 – 2022-11-17 (×2): 40 mg via ORAL
  Filled 2022-11-16 (×2): qty 1

## 2022-11-16 MED ORDER — ORAL CARE MOUTH RINSE: 15.0000 mL | Freq: Once | OROMUCOSAL | Status: AC

## 2022-11-16 MED ORDER — PHENYLEPHRINE 80 MCG/ML (10ML) SYRINGE FOR IV PUSH (FOR BLOOD PRESSURE SUPPORT)
PREFILLED_SYRINGE | INTRAVENOUS | Status: AC
  Filled 2022-11-16: qty 10

## 2022-11-16 MED ORDER — FENTANYL CITRATE (PF) 250 MCG/5ML IJ SOLN
INTRAMUSCULAR | Status: DC | PRN
  Administered 2022-11-16: 50 ug via INTRAVENOUS
  Administered 2022-11-16: 100 ug via INTRAVENOUS
  Administered 2022-11-16 (×2): 50 ug via INTRAVENOUS

## 2022-11-16 MED ORDER — PANTOPRAZOLE SODIUM 40 MG PO TBEC
40.0000 mg | DELAYED_RELEASE_TABLET | Freq: Every day | ORAL | Status: DC
  Administered 2022-11-16 – 2022-11-17 (×2): 40 mg via ORAL
  Filled 2022-11-16 (×2): qty 1

## 2022-11-16 MED ORDER — BUPIVACAINE-EPINEPHRINE (PF) 0.25% -1:200000 IJ SOLN
INTRAMUSCULAR | Status: AC
  Filled 2022-11-16: qty 30

## 2022-11-16 MED ORDER — HYDROMORPHONE HCL 1 MG/ML IJ SOLN
INTRAMUSCULAR | Status: AC
  Filled 2022-11-16: qty 1

## 2022-11-16 MED ORDER — FLUOXETINE HCL 20 MG PO CAPS
20.0000 mg | ORAL_CAPSULE | Freq: Every day | ORAL | Status: DC
  Administered 2022-11-17: 20 mg via ORAL
  Filled 2022-11-16: qty 1

## 2022-11-16 MED ORDER — BUPIVACAINE LIPOSOME 1.3 % IJ SUSP
INTRAMUSCULAR | Status: AC
  Filled 2022-11-16: qty 20

## 2022-11-16 MED ORDER — ROCURONIUM BROMIDE 10 MG/ML (PF) SYRINGE
PREFILLED_SYRINGE | INTRAVENOUS | Status: DC | PRN
  Administered 2022-11-16: 80 mg via INTRAVENOUS
  Administered 2022-11-16 (×2): 10 mg via INTRAVENOUS

## 2022-11-16 MED ORDER — ACETAMINOPHEN 650 MG RE SUPP: 650.0000 mg | RECTAL | Status: DC | PRN

## 2022-11-16 MED ORDER — DOCUSATE SODIUM 100 MG PO CAPS
100.0000 mg | ORAL_CAPSULE | Freq: Two times a day (BID) | ORAL | Status: DC
  Administered 2022-11-16 – 2022-11-17 (×2): 100 mg via ORAL
  Filled 2022-11-16 (×2): qty 1

## 2022-11-16 MED ORDER — DEXAMETHASONE SODIUM PHOSPHATE 10 MG/ML IJ SOLN
INTRAMUSCULAR | Status: AC
  Filled 2022-11-16: qty 1

## 2022-11-16 MED ORDER — PHENYLEPHRINE HCL-NACL 20-0.9 MG/250ML-% IV SOLN
INTRAVENOUS | Status: AC
  Filled 2022-11-16: qty 250

## 2022-11-16 MED ORDER — HYDROCHLOROTHIAZIDE 25 MG PO TABS: 25.0000 mg | ORAL_TABLET | Freq: Every day | ORAL | Status: DC

## 2022-11-16 MED ORDER — POTASSIUM CHLORIDE IN NACL 20-0.9 MEQ/L-% IV SOLN
INTRAVENOUS | Status: DC
  Filled 2022-11-16: qty 1000

## 2022-11-16 MED ORDER — AMISULPRIDE (ANTIEMETIC) 5 MG/2ML IV SOLN: 10.0000 mg | Freq: Once | INTRAVENOUS | Status: DC | PRN

## 2022-11-16 MED ORDER — CEFAZOLIN SODIUM-DEXTROSE 2-4 GM/100ML-% IV SOLN
2.0000 g | Freq: Three times a day (TID) | INTRAVENOUS | Status: AC
  Administered 2022-11-16 (×2): 2 g via INTRAVENOUS
  Filled 2022-11-16 (×2): qty 100

## 2022-11-16 MED ORDER — HYDROCODONE-ACETAMINOPHEN 5-325 MG PO TABS: 1.0000 | ORAL_TABLET | ORAL | Status: DC | PRN

## 2022-11-16 MED ORDER — BISACODYL 5 MG PO TBEC: 5.0000 mg | DELAYED_RELEASE_TABLET | Freq: Every day | ORAL | Status: DC | PRN

## 2022-11-16 MED ORDER — THROMBIN 20000 UNITS EX SOLR
CUTANEOUS | Status: AC
  Filled 2022-11-16: qty 20000

## 2022-11-16 MED ORDER — ZOLPIDEM TARTRATE 5 MG PO TABS: 5.0000 mg | ORAL_TABLET | Freq: Every evening | ORAL | Status: DC | PRN

## 2022-11-16 MED ORDER — OXYCODONE HCL 5 MG/5ML PO SOLN: 5.0000 mg | Freq: Once | ORAL | Status: AC | PRN

## 2022-11-16 MED ORDER — METHOCARBAMOL 500 MG PO TABS
500.0000 mg | ORAL_TABLET | Freq: Four times a day (QID) | ORAL | Status: DC | PRN
  Administered 2022-11-16 – 2022-11-17 (×3): 500 mg via ORAL
  Administered 2022-11-17: 1000 mg via ORAL
  Filled 2022-11-16 (×2): qty 1
  Filled 2022-11-16: qty 2
  Filled 2022-11-16: qty 1

## 2022-11-16 MED ORDER — KETAMINE HCL 10 MG/ML IJ SOLN
INTRAMUSCULAR | Status: DC | PRN
Start: 2022-11-16 — End: 2022-11-16
  Administered 2022-11-16: 10 mg via INTRAVENOUS

## 2022-11-16 MED ORDER — CHLORHEXIDINE GLUCONATE 0.12 % MT SOLN
15.0000 mL | Freq: Once | OROMUCOSAL | Status: AC
  Administered 2022-11-16: 15 mL via OROMUCOSAL
  Filled 2022-11-16: qty 15

## 2022-11-16 MED ORDER — 0.9 % SODIUM CHLORIDE (POUR BTL) OPTIME
TOPICAL | Status: DC | PRN
  Administered 2022-11-16 (×2): 1000 mL

## 2022-11-16 MED ORDER — PROPOFOL 10 MG/ML IV BOLUS
INTRAVENOUS | Status: AC
  Filled 2022-11-16: qty 20

## 2022-11-16 MED ORDER — PROPOFOL 10 MG/ML IV BOLUS
INTRAVENOUS | Status: DC | PRN
  Administered 2022-11-16: 150 mg via INTRAVENOUS

## 2022-11-16 MED ORDER — BUDESONIDE 0.25 MG/2ML IN SUSP
0.2500 mg | Freq: Two times a day (BID) | RESPIRATORY_TRACT | Status: DC
  Administered 2022-11-17: 0.25 mg via RESPIRATORY_TRACT
  Filled 2022-11-16 (×3): qty 2

## 2022-11-16 MED ORDER — PROMETHAZINE HCL 25 MG/ML IJ SOLN: 6.2500 mg | INTRAMUSCULAR | Status: DC | PRN

## 2022-11-16 MED ORDER — LIDOCAINE 2% (20 MG/ML) 5 ML SYRINGE
INTRAMUSCULAR | Status: DC | PRN
  Administered 2022-11-16: 100 mg via INTRAVENOUS

## 2022-11-16 MED ORDER — SENNOSIDES-DOCUSATE SODIUM 8.6-50 MG PO TABS: 1.0000 | ORAL_TABLET | Freq: Every evening | ORAL | Status: DC | PRN

## 2022-11-16 MED ORDER — THROMBIN 20000 UNITS EX SOLR: CUTANEOUS | Status: DC | PRN

## 2022-11-16 MED ORDER — TENAPANOR HCL 50 MG PO TABS: 50.0000 mg | ORAL_TABLET | Freq: Two times a day (BID) | ORAL | Status: DC

## 2022-11-16 MED ORDER — METHOCARBAMOL 1000 MG/10ML IJ SOLN: 500.0000 mg | Freq: Four times a day (QID) | INTRAVENOUS | Status: DC | PRN

## 2022-11-16 MED ORDER — FLEET ENEMA RE ENEM: 1.0000 | ENEMA | Freq: Once | RECTAL | Status: DC | PRN

## 2022-11-16 MED ORDER — FENTANYL CITRATE (PF) 250 MCG/5ML IJ SOLN
INTRAMUSCULAR | Status: AC
  Filled 2022-11-16: qty 5

## 2022-11-16 MED ORDER — KETAMINE HCL 50 MG/5ML IJ SOSY
PREFILLED_SYRINGE | INTRAMUSCULAR | Status: AC
  Filled 2022-11-16: qty 5

## 2022-11-16 MED ORDER — OXYCODONE-ACETAMINOPHEN 5-325 MG PO TABS: 1.0000 | ORAL_TABLET | ORAL | 0 refills | Status: AC | PRN

## 2022-11-16 MED ORDER — OXYCODONE HCL 5 MG PO TABS
5.0000 mg | ORAL_TABLET | Freq: Once | ORAL | Status: AC | PRN
  Administered 2022-11-16: 5 mg via ORAL

## 2022-11-16 MED ORDER — ONDANSETRON HCL 4 MG PO TABS: 4.0000 mg | ORAL_TABLET | Freq: Four times a day (QID) | ORAL | Status: DC | PRN

## 2022-11-16 MED ORDER — SODIUM CHLORIDE 0.9 % IV SOLN: 250.0000 mL | INTRAVENOUS | Status: DC

## 2022-11-16 MED ORDER — MONTELUKAST SODIUM 10 MG PO TABS
10.0000 mg | ORAL_TABLET | Freq: Every day | ORAL | Status: DC
  Administered 2022-11-16: 10 mg via ORAL
  Filled 2022-11-16: qty 1

## 2022-11-16 MED ORDER — PHENYLEPHRINE HCL-NACL 20-0.9 MG/250ML-% IV SOLN
INTRAVENOUS | Status: DC | PRN
  Administered 2022-11-16: 30 ug/min via INTRAVENOUS

## 2022-11-16 MED ORDER — HYDROMORPHONE HCL 1 MG/ML IJ SOLN
0.2500 mg | INTRAMUSCULAR | Status: DC | PRN
  Administered 2022-11-16 (×4): 0.5 mg via INTRAVENOUS

## 2022-11-16 MED ORDER — DEXAMETHASONE SODIUM PHOSPHATE 10 MG/ML IJ SOLN
INTRAMUSCULAR | Status: DC | PRN
  Administered 2022-11-16: 5 mg via INTRAVENOUS

## 2022-11-16 MED ORDER — PHENYLEPHRINE 80 MCG/ML (10ML) SYRINGE FOR IV PUSH (FOR BLOOD PRESSURE SUPPORT)
PREFILLED_SYRINGE | INTRAVENOUS | Status: DC | PRN
  Administered 2022-11-16 (×2): 80 ug via INTRAVENOUS
  Administered 2022-11-16: 160 ug via INTRAVENOUS
  Administered 2022-11-16 (×2): 80 ug via INTRAVENOUS

## 2022-11-16 MED ORDER — LIFITEGRAST 5 % OP SOLN: 1.0000 [drp] | Freq: Two times a day (BID) | OPHTHALMIC | Status: DC

## 2022-11-16 MED ORDER — MIDAZOLAM HCL 2 MG/2ML IJ SOLN
INTRAMUSCULAR | Status: DC | PRN
  Administered 2022-11-16: 2 mg via INTRAVENOUS

## 2022-11-16 SURGICAL SUPPLY — 94 items
AGENT HMST KT MTR STRL THRMB (HEMOSTASIS)
APL SKNCLS STERI-STRIP NONHPOA (GAUZE/BANDAGES/DRESSINGS) ×1
BAG COUNTER SPONGE SURGICOUNT (BAG) ×1 IMPLANT
BAG SPNG CNTER NS LX DISP (BAG) ×1
BENZOIN TINCTURE PRP APPL 2/3 (GAUZE/BANDAGES/DRESSINGS) ×1 IMPLANT
BLADE CLIPPER SURG (BLADE) IMPLANT
BUR PRESCISION 1.7 ELITE (BURR) ×1 IMPLANT
BUR ROUND FLUTED 5 RND (BURR) ×1 IMPLANT
BUR ROUND PRECISION 4.0 (BURR) IMPLANT
BUR SABER RD CUTTING 3.0 (BURR) IMPLANT
CAGE SABLE 10X22 6-12 8D (Cage) IMPLANT
CANNULA GRAFT BNE VG PRE-FILL (Bone Implant) IMPLANT
CNTNR URN SCR LID CUP LEK RST (MISCELLANEOUS) ×1 IMPLANT
CONT SPEC 4OZ STRL OR WHT (MISCELLANEOUS) ×1
COVER BACK TABLE 60X90IN (DRAPES) ×1 IMPLANT
COVER MAYO STAND STRL (DRAPES) ×2 IMPLANT
COVER SURGICAL LIGHT HANDLE (MISCELLANEOUS) ×1 IMPLANT
DISPENSER GRAFT BNE VG (MISCELLANEOUS) IMPLANT
DISPENSER VIVIGEN BONE GRAFT (MISCELLANEOUS) ×1
DRAIN CHANNEL 15F RND FF W/TCR (WOUND CARE) IMPLANT
DRAIN RELI 100 BL SUC LF ST (DRAIN)
DRAPE C-ARM 42X72 X-RAY (DRAPES) ×1 IMPLANT
DRAPE C-ARMOR (DRAPES) IMPLANT
DRAPE POUCH INSTRU U-SHP 10X18 (DRAPES) ×1 IMPLANT
DRAPE SURG 17X23 STRL (DRAPES) ×4 IMPLANT
DURAPREP 26ML APPLICATOR (WOUND CARE) ×1 IMPLANT
ELECT BLADE 4.0 EZ CLEAN MEGAD (MISCELLANEOUS) ×1
ELECT CAUTERY BLADE 6.4 (BLADE) ×1 IMPLANT
ELECT REM PT RETURN 9FT ADLT (ELECTROSURGICAL) ×1
ELECTRODE BLDE 4.0 EZ CLN MEGD (MISCELLANEOUS) ×1 IMPLANT
ELECTRODE REM PT RTRN 9FT ADLT (ELECTROSURGICAL) ×1 IMPLANT
EVACUATOR SILICONE 100CC (DRAIN) IMPLANT
FILTER STRAW FLUID ASPIR (MISCELLANEOUS) ×1 IMPLANT
GAUZE 4X4 16PLY ~~LOC~~+RFID DBL (SPONGE) ×1 IMPLANT
GAUZE SPONGE 4X4 12PLY STRL (GAUZE/BANDAGES/DRESSINGS) ×1 IMPLANT
GLOVE BIO SURGEON STRL SZ 6.5 (GLOVE) ×1 IMPLANT
GLOVE BIO SURGEON STRL SZ8 (GLOVE) ×1 IMPLANT
GLOVE BIOGEL PI IND STRL 7.0 (GLOVE) ×1 IMPLANT
GLOVE BIOGEL PI IND STRL 8 (GLOVE) ×1 IMPLANT
GLOVE SURG ENC MOIS LTX SZ6.5 (GLOVE) ×1 IMPLANT
GOWN STRL REUS W/ TWL LRG LVL3 (GOWN DISPOSABLE) ×2 IMPLANT
GOWN STRL REUS W/ TWL XL LVL3 (GOWN DISPOSABLE) ×1 IMPLANT
GOWN STRL REUS W/TWL LRG LVL3 (GOWN DISPOSABLE) ×2
GOWN STRL REUS W/TWL XL LVL3 (GOWN DISPOSABLE) ×1
GRAFT BONE CANNULA VIVIGEN 3 (Bone Implant) ×2 IMPLANT
IV CATH 14GX2 1/4 (CATHETERS) ×1 IMPLANT
KIT BASIN OR (CUSTOM PROCEDURE TRAY) ×1 IMPLANT
KIT POSITION SURG JACKSON T1 (MISCELLANEOUS) ×1 IMPLANT
KIT TURNOVER KIT B (KITS) ×1 IMPLANT
MARKER SKIN DUAL TIP RULER LAB (MISCELLANEOUS) ×2 IMPLANT
NDL 18GX1X1/2 (RX/OR ONLY) (NEEDLE) ×1 IMPLANT
NDL 22X1.5 STRL (OR ONLY) (MISCELLANEOUS) ×2 IMPLANT
NDL HYPO 25GX1X1/2 BEV (NEEDLE) ×1 IMPLANT
NDL SPNL 18GX3.5 QUINCKE PK (NEEDLE) ×2 IMPLANT
NEEDLE 18GX1X1/2 (RX/OR ONLY) (NEEDLE)
NEEDLE 22X1.5 STRL (OR ONLY) (MISCELLANEOUS) ×1
NEEDLE HYPO 25GX1X1/2 BEV (NEEDLE) ×1
NEEDLE SPNL 18GX3.5 QUINCKE PK (NEEDLE) ×2
NS IRRIG 1000ML POUR BTL (IV SOLUTION) ×1 IMPLANT
PACK LAMINECTOMY ORTHO (CUSTOM PROCEDURE TRAY) ×1 IMPLANT
PACK UNIVERSAL I (CUSTOM PROCEDURE TRAY) ×1 IMPLANT
PAD ARMBOARD 7.5X6 YLW CONV (MISCELLANEOUS) ×2 IMPLANT
PATTIES SURGICAL .5 X1 (DISPOSABLE) ×1 IMPLANT
PATTIES SURGICAL .5X1.5 (GAUZE/BANDAGES/DRESSINGS) ×1 IMPLANT
PUTTY DBX 2.5CC (Putty) ×1 IMPLANT
PUTTY DBX 2.5CC DEPUY (Putty) IMPLANT
ROD PRE BENT EXP 40MM (Rod) IMPLANT
ROD PRE BENT EXPEDIUM 35MM (Rod) IMPLANT
SCREW SET SINGLE INNER (Screw) IMPLANT
SCREW VIPER CORT FIX 5.00X30 (Screw) IMPLANT
SCREW VIPER CORT FIX 6.00X30 (Screw) IMPLANT
SCREW VIPER CORT FIX 6X35 (Screw) IMPLANT
SPONGE INTESTINAL PEANUT (DISPOSABLE) ×1 IMPLANT
SPONGE SURGIFOAM ABS GEL 100 (HEMOSTASIS) ×1 IMPLANT
STRIP CLOSURE SKIN 1/2X4 (GAUZE/BANDAGES/DRESSINGS) ×2 IMPLANT
SURGIFLO W/THROMBIN 8M KIT (HEMOSTASIS) IMPLANT
SUT MNCRL AB 4-0 PS2 18 (SUTURE) ×1 IMPLANT
SUT VIC AB 0 CT1 18XCR BRD 8 (SUTURE) ×1 IMPLANT
SUT VIC AB 0 CT1 8-18 (SUTURE) ×1
SUT VIC AB 1 CT1 18XCR BRD 8 (SUTURE) ×1 IMPLANT
SUT VIC AB 1 CT1 8-18 (SUTURE) ×2
SUT VIC AB 2-0 CT2 18 VCP726D (SUTURE) ×1 IMPLANT
SYR 20ML LL LF (SYRINGE) ×2 IMPLANT
SYR BULB IRRIG 60ML STRL (SYRINGE) ×1 IMPLANT
SYR CONTROL 10ML LL (SYRINGE) ×2 IMPLANT
SYR TB 1ML LUER SLIP (SYRINGE) ×1 IMPLANT
TAP EXPEDIUM DL 4.35 (INSTRUMENTS) IMPLANT
TAP EXPEDIUM DL 5.0 (INSTRUMENTS) IMPLANT
TAP EXPEDIUM DL 6.0 (INSTRUMENTS) IMPLANT
TAPE CLOTH SOFT 2X10 (GAUZE/BANDAGES/DRESSINGS) IMPLANT
TRAY FOLEY MTR SLVR 16FR STAT (SET/KITS/TRAYS/PACK) ×1 IMPLANT
TUBE FUNNEL GL DISP (ORTHOPEDIC DISPOSABLE SUPPLIES) IMPLANT
WATER STERILE IRR 1000ML POUR (IV SOLUTION) ×1 IMPLANT
YANKAUER SUCT BULB TIP NO VENT (SUCTIONS) ×1 IMPLANT

## 2022-11-16 NOTE — Anesthesia Procedure Notes (Signed)
Procedure Name: Intubation Date/Time: 11/16/2022 8:45 AM  Performed by: Aris Georgia, CRNAPre-anesthesia Checklist: Patient identified Oxygen Delivery Method: Circle system utilized Preoxygenation: Pre-oxygenation with 100% oxygen Induction Type: IV induction Ventilation: Mask ventilation without difficulty Laryngoscope Size: Miller and 2 Grade View: Grade III Tube type: Oral Number of attempts: 2 Placement Confirmation: ETT inserted through vocal cords under direct vision, positive ETCO2 and breath sounds checked- equal and bilateral Secured at: 23 cm Tube secured with: Tape Dental Injury: Teeth and Oropharynx as per pre-operative assessment

## 2022-11-16 NOTE — Op Note (Signed)
PATIENT NAME: Kristen Holland   MEDICAL RECORD NO.:   [id number]   DATE OF BIRTH: 1965-08-24   DATE OF PROCEDURE: 11/16/2022                               OPERATIVE REPORT   PREOPERATIVE DIAGNOSES: 1. Bilateral lumbar radiculopathy 2. L5-S1 degenerative disc disease   POSTOPERATIVE DIAGNOSES: 1. Bilateral lumbar radiculopathy 2. L5-S1 degenerative disc disease   PROCEDURES: 1. Left-sided L5-S1 transforaminal lumbar interbody fusion. 2. Right-sided L5-S1 posterolateral fusion. 3. Insertion of interbody device x1 (Globus expandable intervertebral spacer). 4. Placement of posterior instrumentation at L5, S1 bilaterally. 5. Use of local autograft. 6. Use of morselized allograft - ViviGen, DBX mix. 7. Intraoperative use of fluoroscopy.   SURGEON:  Estill Bamberg, MD.   ASSISTANTJason Coop, PA-C.   ANESTHESIA:  General endotracheal anesthesia.   COMPLICATIONS:  None.   DISPOSITION:  Stable.   ESTIMATED BLOOD LOSS:   Minimal   INDICATIONS FOR SURGERY:  Briefly, Kristen Holland is a pleasant 57 year old female who did present to me with severe and ongoing pain in the back and bilateral Legs, s/p a work injury.  She did exhaust extensive treatment measures for her pain.  Her workup did strongly support that her pain was emanating from her L5-S1 degenerative disc disease.  Specifically, she did obtain excellent relief of her back pain temporarily following an intradiscal injection at L5-S1.  Given her ongoing pain despite multiple treatment measures, we did have an extensive discussion about surgery, and she did elect to proceed with the procedure noted above.      OPERATIVE DETAILS:  On 11/16/2022, the patient was brought to surgery and general endotracheal anesthesia was administered.  The patient was placed prone on a well-padded flat Jackson bed with a spinal frame.  Antibiotics were given and a time-out procedure was performed. The back was prepped and draped in the usual fashion.   A midline incision was made overlying the L5-S1 intervertebral space.  The fascia was incised at the midline.  The paraspinal musculature was bluntly swept laterally.  Anatomic landmarks for the pedicles were exposed. Using fluoroscopy, I did cannulate the L5 and S1 pedicles bilaterally, using a medial to lateral cortical trajectory technique.  On the right side, the posterolateral gutter and right facet joint at L5/S1 was decorticated and 6 mm screws were placed and a 40-mm rod was placed and distraction was applied across the rod on the right side.  On the left side, the cannulated pedicle holes were filled with bone wax.  I then proceeded with the decompressive aspect of the procedure.  On the left side, I did perform a thorough and complete lateral recess and neuroforaminal decompression, with near-complete removal of the left L5-S1 facet joint.  With an assistant holding medial retraction of the traversing left S1 nerve, I did perform an annulotomy at the posterolateral aspect of the L5/S1 intervertebral space.  I then used a series of curettes and pituitary rongeurs to perform a thorough and complete intervertebral diskectomy.  The intervertebral space was then liberally packed with autograft as well as allograft in the form of ViviGen and DBX mix, as was the appropriate-sized intervertebral spacer.  The spacer was then tamped into position in the usual fashion, and expanded to 8mm in height.  I was very pleased with the press-fit of the spacer.  I then placed 6 mm screws on the left at L5 and  S1.  A 35 mm rod was then placed and caps were placed. The distraction was then released on the contralateral right side.  All 4 caps were then locked.  The wound was copiously irrigated with a total of approximately 3 L prior to placing the bone graft.  Additional autograft and allograft were then packed into the posterolateral gutter on the right side to help aid in the L5-S1 fusion.  The  wound was explored for any undue bleeding and there was no substantial bleeding encountered.  Gel-Foam was placed over the laminectomy site.  The wound was then closed in layers using #1 Vicryl followed by 2-0 Vicryl, followed by 4-0 Monocryl.  Benzoin and Steri-Strips were applied followed by sterile dressing.    Of note, Jason Coop was my assistant throughout surgery, and did aid in retraction, suctioning, and closure.       Estill Bamberg, MD

## 2022-11-16 NOTE — Transfer of Care (Signed)
Immediate Anesthesia Transfer of Care Note  Patient: Kristen Holland  Procedure(s) Performed: LEFT-SIDED LUMBAR 5 - SACRUM 1 TRANSFORAMINAL LUMBAR INTERBODY FUSION AND DECOMPRESSION WITH INSTRUMENTATION AND ALLOGRAFT (Left: Spine Lumbar)  Patient Location: PACU  Anesthesia Type:General  Level of Consciousness: awake and alert   Airway & Oxygen Therapy: Patient Spontanous Breathing and Patient connected to nasal cannula oxygen  Post-op Assessment: Report given to RN and Post -op Vital signs reviewed and stable  Post vital signs: Reviewed  Last Vitals:  Vitals Value Taken Time  BP 133/70 11/16/22 1145  Temp    Pulse 89 11/16/22 1148  Resp 23 11/16/22 1148  SpO2 99 % 11/16/22 1148  Vitals shown include unfiled device data.  Last Pain:  Vitals:   11/16/22 0710  TempSrc:   PainSc: 6       Patients Stated Pain Goal: 0 (11/16/22 0710)  Complications: No notable events documented.

## 2022-11-16 NOTE — Anesthesia Postprocedure Evaluation (Signed)
Anesthesia Post Note  Patient: Kristen Holland  Procedure(s) Performed: LEFT-SIDED LUMBAR 5 - SACRUM 1 TRANSFORAMINAL LUMBAR INTERBODY FUSION AND DECOMPRESSION WITH INSTRUMENTATION AND ALLOGRAFT (Left: Spine Lumbar)     Patient location during evaluation: PACU Anesthesia Type: General Level of consciousness: awake and alert Pain management: pain level controlled Vital Signs Assessment: post-procedure vital signs reviewed and stable Respiratory status: spontaneous breathing, nonlabored ventilation and respiratory function stable Cardiovascular status: blood pressure returned to baseline and stable Postop Assessment: no apparent nausea or vomiting Anesthetic complications: no   No notable events documented.  Last Vitals:  Vitals:   11/16/22 1315 11/16/22 1317  BP: 120/61 120/61  Pulse: 80 76  Resp: (!) 9 12  Temp:  36.8 C  SpO2: 98% 99%    Last Pain:  Vitals:   11/16/22 0710  TempSrc:   PainSc: 6                  Lowella Curb

## 2022-11-16 NOTE — H&P (Signed)
PREOPERATIVE H&P  Chief Complaint: Low back pain  HPI: Kristen Holland is a 57 y.o. female who presents with ongoing pain in the low back s/p a work injury  MRI reveals L5/S1 DDD  Patient has failed multiple forms of conservative care and continues to have pain (see office notes for additional details regarding the patient's full course of treatment)  Past Medical History:  Diagnosis Date   Abdominal pain    Allergy    Anxiety    Arthritis    Asthma    Back pain    Constipation    Depression    Diabetes mellitus without complication (HCC)    type 2   Fatty liver    Gallbladder problem    GERD (gastroesophageal reflux disease)    Hyperlipidemia    Hypertension    Joint pain    Nausea & vomiting    Pneumonia    x several   Seasonal allergies    Sleep apnea    uses CPAP   Vitamin D deficiency    Past Surgical History:  Procedure Laterality Date   ABDOMINAL HYSTERECTOMY     APPENDECTOMY     BACK SURGERY     WRIST SURGERY Left    tendon repair - at [facility]   Social History   Socioeconomic History   Marital status: Married    Spouse name: Minerva Areola   Number of children: Not on file   Years of education: Not on file   Highest education level: Not on file  Occupational History   Occupation: bus driver  Tobacco Use   Smoking status: Never   Smokeless tobacco: Never  Vaping Use   Vaping status: Never Used  Substance and Sexual Activity   Alcohol use: Not Currently    Comment: occasional   Drug use: No   Sexual activity: Yes    Birth control/protection: Surgical    Comment: Hysterectomy  Other Topics Concern   Not on file  Social History Narrative   Not on file   Social Determinants of Health   Financial Resource Strain: Not on file  Food Insecurity: Not on file  Transportation Needs: Not on file  Physical Activity: Not on file  Stress: Not on file  Social Connections: Unknown (07/18/2021)   Received from [employer], [facility]    Social Network    Social Network: Not on file   Family History  Problem Relation Age of Onset   Hypertension Mother    Diabetes Mother    Cancer Mother    Depression Mother    Alcoholism Mother    Drug abuse Mother    Cancer Father    Diabetes Father    Hypertension Father    Kidney disease Father    Liver disease Father    Alcoholism Father    Heart murmur Sister    Heart failure Child    No Known Allergies Prior to Admission medications   Medication Sig Start Date End Date Taking? Authorizing Provider  albuterol (PROVENTIL HFA;VENTOLIN HFA) 108 (90 BASE) MCG/ACT inhaler Inhale 1-2 puffs into the lungs every 6 (six) hours as needed for wheezing or shortness of breath. 11/21/13  Yes Mellody Drown, PA-C  atorvastatin (LIPITOR) 40 MG tablet Take 40 mg by mouth daily. 10/31/14  Yes [provider]  FLUoxetine (PROZAC) 20 MG capsule Take 20 mg by mouth daily. 03/01/22  Yes [provider]  fluticasone (FLOVENT HFA) 110 MCG/ACT inhaler Inhale 1 puff  into the lungs 2 (two) times daily as needed (shortness of breath).   Yes [provider]  irbesartan (AVAPRO) 300 MG tablet Take 300 mg by mouth daily.   Yes [provider]  metFORMIN (GLUCOPHAGE-XR) 500 MG 24 hr tablet Take 500 mg by mouth daily. 03/03/16  Yes [provider]  methocarbamol (ROBAXIN) 500 MG tablet Take 500-1,000 mg by mouth every 6 (six) hours as needed for muscle spasms.   Yes [provider]  montelukast (SINGULAIR) 10 MG tablet Take 10 mg by mouth at bedtime.   Yes [provider]  pantoprazole (PROTONIX) 40 MG tablet Take 40 mg by mouth daily.   Yes [provider]  Tenapanor HCl (IBSRELA) 50 MG TABS Take 50 mg by mouth in the morning and at bedtime.   Yes [provider]  tirzepatide Greggory Keen) 7.5 MG/0.5ML Pen Inject 7.5 mg into the skin once a week. Patient taking differently: Inject 7.5 mg into the skin once a week. Takes on Tuesdays  11/09/21  Yes Corinna Capra A, DO  XIIDRA 5 % SOLN Place 1 drop into both eyes in the morning and at bedtime. 10/13/17  Yes [provider]  benzonatate (TESSALON) 100 MG capsule Take 1 capsule (100 mg total) by mouth 3 (three) times daily as needed for cough. Patient not taking: Reported on 10/28/2022 03/06/22   Mickie Bail, NP  buPROPion [facility] SR) 150 MG 12 hr tablet Take 1 tablet (150 mg total) by mouth daily. Patient not taking: Reported on 10/28/2022 05/04/22   Worthy Rancher, MD  hydrochlorothiazide (HYDRODIURIL) 25 MG tablet TAKE 1 TABLET BY MOUTH EVERY DAY Patient not taking: Reported on 10/28/2022 02/16/18   Croitoru, Rachelle Hora, MD     All other systems have been reviewed and were otherwise negative with the exception of those mentioned in the HPI and as above.  Physical Exam: Vitals:   11/16/22 0643  BP: 123/77  Pulse: 65  Resp: 17  Temp: 97.9 F (36.6 C)  SpO2: 100%    Body mass index is 29.95 kg/m.  General: Alert, no acute distress Cardiovascular: No pedal edema Respiratory: No cyanosis, no use of accessory musculature Skin: No lesions in the area of chief complaint Neurologic: Sensation intact distally Psychiatric: Patient is competent for consent with normal mood and affect Lymphatic: No axillary or cervical lymphadenopathy   Assessment/Plan: DEGENERATIVE DISC DISEASE L5/S1 Plan for Procedure(s): LEFT-SIDED LUMBAR 5 - SACRUM 1 TRANSFORAMINAL LUMBAR INTERBODY FUSION AND DECOMPRESSION WITH INSTRUMENTATION AND ALLOGRAFT   Jackelyn Hoehn, MD 11/16/2022 8:18 AM

## 2022-11-17 ENCOUNTER — Encounter (HOSPITAL_COMMUNITY): Payer: Self-pay | Admitting: Orthopedic Surgery

## 2022-11-17 LAB — GLUCOSE, CAPILLARY
Glucose-Capillary: 95 mg/dL (ref 70–99)
Glucose-Capillary: 111 mg/dL — ABNORMAL HIGH (ref 70–99)

## 2022-11-17 NOTE — Progress Notes (Signed)
Walker/ BSC ordered through [employer] workers comp has approved the DME: Rush Barer [phone]  fax [phone]

## 2022-11-17 NOTE — Evaluation (Signed)
Occupational Therapy Evaluation Patient Details Name: Kristen Holland MRN: [id number] DOB: 03/24/1965 Today's Date: 11/17/2022   History of Present Illness Pt is a 57 y.o. female s/p L sided L5-S1 transforaminal interbody fusion and decompression with instrumentation and allograft. PMH significant for DMII, arthritis, asthma, depression, GERD, HLD, HTN, recurrent pneumonia, sleep apnea.   Clinical Impression   PTA, pt lived with husband and was mod I in ADL and IADL. Upon eval, pt performing UB ADL with up to min A for brace application and LB ADL with CGA with AE. Pt educated and demonstrating use of compensatory techniques/AE for bed mobility, LB ADL, grooming, toileting, and shower transfers within precautions. All education provided and questions answered with pt able to recall education with increased time. Recommending discharge home with no OT follow up, but will follow for one more session acutely to optimize carryover of precautions if still here tomorrow.       If plan is discharge home, recommend the following: A little help with walking and/or transfers;A little help with bathing/dressing/bathroom;Assistance with cooking/housework;Assist for transportation;Help with stairs or ramp for entrance    Functional Status Assessment  Patient has had a recent decline in their functional status and demonstrates the ability to make significant improvements in function in a reasonable and predictable amount of time.  Equipment Recommendations  BSC/3in1;Other (comment) (RW)    Recommendations for Other Services       Precautions / Restrictions Precautions Precautions: Back Precaution Booklet Issued: Yes (comment) Precaution Comments: All precautions reviewed within the context of ADL Required Braces or Orthoses: Spinal Brace Spinal Brace: Thoracolumbosacral orthotic (when OOB except night time trips to restroom) Restrictions Weight Bearing Restrictions: No      Mobility Bed  Mobility Overal bed mobility: Needs Assistance Bed Mobility: Rolling, Sidelying to Sit, Sit to Sidelying Rolling: Contact guard assist Sidelying to sit: Contact guard assist     Sit to sidelying: Contact guard assist General bed mobility comments: for safety    Transfers Overall transfer level: Needs assistance Equipment used: Rolling walker (2 wheels) Transfers: Sit to/from Stand Sit to Stand: Contact guard assist           General transfer comment: for safety; slow to rise, cues for hand placement      Balance Overall balance assessment: Needs assistance Sitting-balance support: No upper extremity supported, Feet supported Sitting balance-Leahy Scale: Good     Standing balance support: Bilateral upper extremity supported, During functional activity Standing balance-Leahy Scale: Poor Standing balance comment: reliant on RW                           ADL either performed or assessed with clinical judgement   ADL Overall ADL's : Needs assistance/impaired Eating/Feeding: Independent   Grooming: Contact guard assist;Standing   Upper Body Bathing: Set up;Sitting   Lower Body Bathing: Minimal assistance;Sit to/from stand Lower Body Bathing Details (indicate cue type and reason): reviewed AE that may be helpful and optimize independence Upper Body Dressing : Minimal assistance;Sitting Upper Body Dressing Details (indicate cue type and reason): for brace application only. Lower Body Dressing: Contact guard assist;Sit to/from stand;With adaptive equipment;Adhering to back precautions Lower Body Dressing Details (indicate cue type and reason): Pt able to achieve a modified figure 4 with discomort but still difficult to reach feet from this position, so educated re:AE and pt able to perform with CGA Toilet Transfer: Contact guard assist;Rolling walker (2 wheels);Ambulation   Toileting- Clothing Manipulation and Hygiene:  Contact guard assist;Sit to/from stand    Tub/ Shower Transfer: Walk-in shower;Contact guard assist;Ambulation;Rolling walker (2 wheels);BSC/3in1 Web designer Details (indicate cue type and reason): min cues for use of compensatory techniques Functional mobility during ADLs: Contact guard assist;Rolling walker (2 wheels)       Vision Baseline Vision/History: 1 Wears glasses Ability to See in Adequate Light: 0 Adequate Patient Visual Report: No change from baseline Vision Assessment?: No apparent visual deficits     Perception Perception: Not tested       Praxis Praxis: Not tested       Pertinent Vitals/Pain Pain Assessment Pain Assessment: Faces Faces Pain Scale: Hurts even more Pain Location: operative site, RLE Pain Descriptors / Indicators: Aching, Constant, Operative site guarding, Grimacing Pain Intervention(s): Limited activity within patient's tolerance, Monitored during session     Extremity/Trunk Assessment Upper Extremity Assessment Upper Extremity Assessment: Overall WFL for tasks assessed   Lower Extremity Assessment Lower Extremity Assessment: Defer to PT evaluation   Cervical / Trunk Assessment Cervical / Trunk Assessment: Back Surgery   Communication Communication Communication: No apparent difficulties Cueing Techniques: Verbal cues   Cognition Arousal: Alert Behavior During Therapy: WFL for tasks assessed/performed Overall Cognitive Status: Within Functional Limits for tasks assessed                                 General Comments: Increased time for learning due to lethargy secondary to pain medication but able to recall education at end of session     General Comments  VSS    Exercises     Shoulder Instructions      Home Living Family/patient expects to be discharged to:: Private residence Living Arrangements: Spouse/significant other;Children Available Help at Discharge: Family;Available PRN/intermittently Type of Home: House Home Access: Other  (comment) (threshold)     Home Layout: Two level;Able to live on main level with bedroom/bathroom     Bathroom Shower/Tub: Producer, television/film/video: Handicapped height Bathroom Accessibility: Yes How Accessible: Accessible via walker Home Equipment: None          Prior Functioning/Environment Prior Level of Function : Independent/Modified Independent;Driving             Mobility Comments: no AD ADLs Comments: Indep in ADL and IADL        OT Problem List: Decreased strength;Decreased activity tolerance;Impaired balance (sitting and/or standing);Decreased knowledge of use of DME or AE;Decreased knowledge of precautions;Pain      OT Treatment/Interventions: Self-care/ADL training;Therapeutic exercise;DME and/or AE instruction;Balance training;Patient/family education;Cognitive remediation/compensation;Therapeutic activities    OT Goals(Current goals can be found in the care plan section) Acute Rehab OT Goals Patient Stated Goal: get better OT Goal Formulation: With patient Time For Goal Achievement: 12/01/22 Potential to Achieve Goals: Good  OT Frequency: Min 1X/week    Co-evaluation              AM-PAC OT "6 Clicks" Daily Activity     Outcome Measure Help from another person eating meals?: None Help from another person taking care of personal grooming?: A Little Help from another person toileting, which includes using toliet, bedpan, or urinal?: A Little Help from another person bathing (including washing, rinsing, drying)?: A Little Help from another person to put on and taking off regular upper body clothing?: A Little Help from another person to put on and taking off regular lower body clothing?: A Little 6 Click Score: 19   End of  Session Equipment Utilized During Treatment: Gait belt;Rolling walker (2 wheels);Back brace Nurse Communication: Mobility status  Activity Tolerance: Patient tolerated treatment well Patient left: in bed;with call  bell/phone within reach  OT Visit Diagnosis: Unsteadiness on feet (R26.81);Muscle weakness (generalized) (M62.81);Pain Pain - Right/Left: Right Pain - part of body: Leg (back)                Time: 4696-2952 OT Time Calculation (min): 32 min Charges:  OT General Charges $OT Visit: 1 Visit OT Evaluation $OT Eval Low Complexity: 1 Low OT Treatments $Self Care/Home Management : 8-22 mins  Kristen Holland, OTR/L [facility] Acute Rehabilitation Office: [phone]   Kristen Holland 11/17/2022, 9:19 AM

## 2022-11-17 NOTE — Progress Notes (Signed)
Patient alert and oriented, voiding adequately, skin clean, dry and intact without evidence of skin break down, or symptoms of complications - no redness or edema noted, only slight tenderness at site.  Patient states pain is manageable at time of discharge. Patient has an appointment with MD in 2 weeks 

## 2022-11-17 NOTE — Evaluation (Signed)
Physical Therapy Evaluation  Patient Details Name: Kristen Holland MRN: [id number] DOB: [birth year] Today's Date: 11/17/2022  History of Present Illness  Pt is a 57 y.o. female who presents s/p L sided L5-S1 TLIF on 11/16/2022. PMH significant for DM II, arthritis, asthma, depression, HTN, recurrent pneumonia, sleep apnea.   Clinical Impression  Pt admitted with above diagnosis. At the time of PT eval, pt was able to demonstrate transfers and ambulation with gross min guard assist and RW for support. Pt moving slow and guarded overall due to pain and reports feeling shaky. Pt reports feeling fearful of falling. Pt was educated on precautions, brace application/wearing schedule, appropriate activity progression, and car transfer. Pt currently with functional limitations due to the deficits listed below (see PT Problem List). Pt will benefit from skilled PT to increase their independence and safety with mobility to allow discharge to the venue listed below.          If plan is discharge home, recommend the following: A little help with walking and/or transfers;A little help with bathing/dressing/bathroom;Assistance with cooking/housework;Assist for transportation;Help with stairs or ramp for entrance   Can travel by private vehicle        Equipment Recommendations Rolling walker (2 wheels);BSC/3in1  Recommendations for Other Services       Functional Status Assessment Patient has had a recent decline in their functional status and demonstrates the ability to make significant improvements in function in a reasonable and predictable amount of time.     Precautions / Restrictions Precautions Precautions: Back Precaution Booklet Issued: Yes (comment) Precaution Comments: All precautions reviewed within the context of ADL Required Braces or Orthoses: Spinal Brace Spinal Brace: Thoracolumbosacral orthotic;Applied in sitting position Restrictions Weight Bearing Restrictions: No      Mobility   Bed Mobility Overal bed mobility: Needs Assistance Bed Mobility: Rolling, Sidelying to Sit, Sit to Sidelying Rolling: Supervision Sidelying to sit: Supervision     Sit to sidelying: Supervision General bed mobility comments: VC's for optimal log roll technique. HOB elevated, knees of bed flat, and rails lowered to simulate home environment. No assist required.    Transfers Overall transfer level: Needs assistance Equipment used: Rolling walker (2 wheels) Transfers: Sit to/from Stand Sit to Stand: Contact guard assist           General transfer comment: VC's for hand placement on seated surface for safety. Hands on guarding with rise.    Ambulation/Gait Ambulation/Gait assistance: Contact guard assist Gait Distance (Feet): 225 Feet Assistive device: Rolling walker (2 wheels) Gait Pattern/deviations: Step-through pattern, Decreased stride length, Trunk flexed Gait velocity: Decreased Gait velocity interpretation: <1.31 ft/sec, indicative of household ambulator   General Gait Details: VC's for improved posture, closer walker proximity and forward gaze. Pt reports LE's feel wobbly and she is fearful of legs giving out on her and falling. Hands on guarding throughout gait training. No overt LOB or knee buckling noted but pt maintains that she feels wobbly.  Stairs            Wheelchair Mobility     Tilt Bed    Modified Rankin (Stroke Patients Only)       Balance Overall balance assessment: Needs assistance Sitting-balance support: No upper extremity supported, Feet supported Sitting balance-Leahy Scale: Good     Standing balance support: Bilateral upper extremity supported, During functional activity Standing balance-Leahy Scale: Poor Standing balance comment: reliant on RW  Pertinent Vitals/Pain Pain Assessment Pain Assessment: Faces Faces Pain Scale: Hurts whole lot Pain Location: operative site, RLE Pain  Descriptors / Indicators: Aching, Constant, Operative site guarding, Grimacing Pain Intervention(s): Limited activity within patient's tolerance, Monitored during session, Repositioned    Home Living Family/patient expects to be discharged to:: Private residence Living Arrangements: Spouse/significant other;Children Available Help at Discharge: Family;Available PRN/intermittently Type of Home: House Home Access: Other (comment) (threshold)     Alternate Level Stairs-Number of Steps: flight Home Layout: Two level;Able to live on main level with bedroom/bathroom Home Equipment: None      Prior Function Prior Level of Function : Independent/Modified Independent;Driving             Mobility Comments: no AD ADLs Comments: Indep in ADL and IADL     Extremity/Trunk Assessment   Upper Extremity Assessment Upper Extremity Assessment: Defer to OT evaluation    Lower Extremity Assessment Lower Extremity Assessment: Generalized weakness (Decreased strength and muscular endurance consistent with pre-op diagnosis and acute pain.)    Cervical / Trunk Assessment Cervical / Trunk Assessment: Back Surgery  Communication   Communication Communication: No apparent difficulties Cueing Techniques: Verbal cues;Gestural cues  Cognition Arousal: Alert Behavior During Therapy: WFL for tasks assessed/performed Overall Cognitive Status: Within Functional Limits for tasks assessed                                          General Comments General comments (skin integrity, edema, etc.): VSS    Exercises     Assessment/Plan    PT Assessment Patient needs continued PT services  PT Problem List Decreased strength;Decreased activity tolerance;Decreased balance;Decreased mobility;Decreased knowledge of use of DME;Decreased knowledge of precautions;Decreased safety awareness;Pain       PT Treatment Interventions DME instruction;Gait training;Functional mobility  training;Therapeutic activities;Therapeutic exercise;Balance training;Patient/family education    PT Goals (Current goals can be found in the Care Plan section)  Acute Rehab PT Goals Patient Stated Goal: Home tomorrow PT Goal Formulation: With patient Time For Goal Achievement: 11/24/22 Potential to Achieve Goals: Good    Frequency Min 5X/week     Co-evaluation               AM-PAC PT "6 Clicks" Mobility  Outcome Measure Help needed turning from your back to your side while in a flat bed without using bedrails?: A Little Help needed moving from lying on your back to sitting on the side of a flat bed without using bedrails?: A Little Help needed moving to and from a bed to a chair (including a wheelchair)?: A Little Help needed standing up from a chair using your arms (e.g., wheelchair or bedside chair)?: A Little Help needed to walk in hospital room?: A Little Help needed climbing 3-5 steps with a railing? : A Little 6 Click Score: 18    End of Session Equipment Utilized During Treatment: Gait belt;Back brace Activity Tolerance: Patient tolerated treatment well Patient left: in bed;with call bell/phone within reach Nurse Communication: Mobility status PT Visit Diagnosis: Unsteadiness on feet (R26.81);Pain Pain - part of body:  (back)    Time: 1610-9604 PT Time Calculation (min) (ACUTE ONLY): 22 min   Charges:   PT Evaluation $PT Eval Low Complexity: 1 Low   PT General Charges $$ ACUTE PT VISIT: 1 Visit         Kristen Holland, PT, DPT Acute Rehabilitation Services Secure Chat Preferred  Office: [phone]   Marylynn Pearson 11/17/2022, 10:47 AM

## 2022-11-17 NOTE — Progress Notes (Signed)
    Patient doing well  + right leg pain c/w pre-operative pain + expected low back pain   Physical Exam: Vitals:   11/17/22 0441 11/17/22 0717  BP: (!) 116/59 113/64  Pulse: 66 70  Resp: 18 20  Temp: 97.7 F (36.5 C) 98.1 F (36.7 C)  SpO2: 92% 95%    Dressing in place NVI  POD #1 s/p L5/S1 fusion, doing well  - up with PT/OT, encourage ambulation - Percocet for pain, Robaxin for muscle spasms - likely d/c home today with f/u in 2 weeks

## 2022-11-21 ENCOUNTER — Encounter (HOSPITAL_COMMUNITY): Payer: Self-pay | Admitting: Orthopedic Surgery

## 2022-12-07 NOTE — Discharge Summary (Signed)
Patient ID: Kristen Holland MRN: [id number] DOB/AGE: 55-Feb-1967 55 y.o.  Admit date: 11/16/2022 Discharge date: 11/17/2022  Admission Diagnoses:  Principal Problem:   Radiculopathy, lumbar region   Discharge Diagnoses:  Same  Past Medical History:  Diagnosis Date   Abdominal pain    Allergy    Anxiety    Arthritis    Asthma    Back pain    Constipation    Depression    Diabetes mellitus without complication (HCC)    type 2   Fatty liver    Gallbladder problem    GERD (gastroesophageal reflux disease)    Hyperlipidemia    Hypertension    Joint pain    Nausea & vomiting    Pneumonia    x several   Seasonal allergies    Sleep apnea    uses CPAP   Vitamin D deficiency     Surgeries: Procedure(s): LEFT-SIDED LUMBAR 5 - SACRUM 1 TRANSFORAMINAL LUMBAR INTERBODY FUSION AND DECOMPRESSION WITH INSTRUMENTATION AND ALLOGRAFT on 11/16/2022   Consultants: None  Discharged Condition: Improved  Hospital Course: Kristen Holland is an 57 y.o. female who was admitted 11/16/2022 for operative treatment of Radiculopathy, lumbar region. Patient has severe unremitting pain that affects sleep, daily activities, and work/hobbies. After pre-op clearance the patient was taken to the operating room on 11/16/2022 and underwent  Procedure(s): LEFT-SIDED LUMBAR 5 - SACRUM 1 TRANSFORAMINAL LUMBAR INTERBODY FUSION AND DECOMPRESSION WITH INSTRUMENTATION AND ALLOGRAFT.    Patient was given perioperative antibiotics:  Anti-infectives (From admission, onward)    Start     Dose/Rate Route Frequency Ordered Stop   11/16/22 1700  ceFAZolin (ANCEF) IVPB 2g/100 mL premix        2 g 200 mL/hr over 30 Minutes Intravenous Every 8 hours 11/16/22 1320 11/16/22 2350   11/16/22 0700  ceFAZolin (ANCEF) IVPB 2g/100 mL premix        2 g 200 mL/hr over 30 Minutes Intravenous On call to O.R. 11/16/22 0865 11/16/22 0843        Patient was given sequential compression devices, early ambulation to prevent  DVT.  Patient benefited maximally from hospital stay and there were no complications.    Recent vital signs: BP 102/72 (BP Location: Right Arm)   Pulse 72   Temp 98.2 F (36.8 C) (Oral)   Resp 20   Ht 5\' 5"  (1.651 m)   Wt 81.6 kg   SpO2 98%   BMI 29.95 kg/m    Discharge Medications:   Allergies as of 11/17/2022   No Known Allergies      Medication List     TAKE these medications    albuterol 108 (90 Base) MCG/ACT inhaler Commonly known as: VENTOLIN HFA Inhale 1-2 puffs into the lungs every 6 (six) hours as needed for wheezing or shortness of breath.   atorvastatin 40 MG tablet Commonly known as: LIPITOR Take 40 mg by mouth daily.   benzonatate 100 MG capsule Commonly known as: TESSALON Take 1 capsule (100 mg total) by mouth 3 (three) times daily as needed for cough.   buPROPion 150 MG 12 hr tablet Commonly known as: Wellbutrin SR Take 1 tablet (150 mg total) by mouth daily.   FLUoxetine 20 MG capsule Commonly known as: PROZAC Take 20 mg by mouth daily.   fluticasone 110 MCG/ACT inhaler Commonly known as: FLOVENT HFA Inhale 1 puff into the lungs 2 (two) times daily as needed (shortness of breath).   hydrochlorothiazide 25 MG tablet Commonly known as:  HYDRODIURIL TAKE 1 TABLET BY MOUTH EVERY DAY   Ibsrela 50 MG Tabs Generic drug: Tenapanor HCl Take 50 mg by mouth in the morning and at bedtime.   irbesartan 300 MG tablet Commonly known as: AVAPRO Take 300 mg by mouth daily.   metFORMIN 500 MG 24 hr tablet Commonly known as: GLUCOPHAGE-XR Take 500 mg by mouth daily.   methocarbamol 500 MG tablet Commonly known as: ROBAXIN Take 500-1,000 mg by mouth every 6 (six) hours as needed for muscle spasms. What changed: Another medication with the same name was added. Make sure you understand how and when to take each.   methocarbamol 500 MG tablet Commonly known as: ROBAXIN Take 1-2 tablets (500-1,000 mg total) by mouth every 6 (six) hours as needed for  muscle spasms. What changed: You were already taking a medication with the same name, and this prescription was added. Make sure you understand how and when to take each.   montelukast 10 MG tablet Commonly known as: SINGULAIR Take 10 mg by mouth at bedtime.   oxyCODONE-acetaminophen 5-325 MG tablet Commonly known as: PERCOCET/ROXICET Take 1-2 tablets by mouth every 4 (four) hours as needed for moderate pain or severe pain.   pantoprazole 40 MG tablet Commonly known as: PROTONIX Take 40 mg by mouth daily.   tirzepatide 7.5 MG/0.5ML Pen Commonly known as: MOUNJARO Inject 7.5 mg into the skin once a week. What changed: additional instructions   Xiidra 5 % Soln Generic drug: Lifitegrast Place 1 drop into both eyes in the morning and at bedtime.        Diagnostic Studies: DG Lumbar Spine 2-3 Views  Result Date: 11/16/2022 CLINICAL DATA:  L5-S1 discectomy and fusion EXAM: LUMBAR SPINE - 2-3 VIEW COMPARISON:  None Available. FINDINGS: Two C-arm images show discectomy at L5-S1 with interbody spacer, central into the left within the disc space. Bilateral pedicle screws and posterior rods. IMPRESSION: L5-S1 discectomy and fusion as above. Electronically Signed   By: Paulina Fusi M.D.   On: 11/16/2022 15:04   DG Lumbar Spine 1 View  Result Date: 11/16/2022 CLINICAL DATA:  Intraoperative localization EXAM: LUMBAR SPINE - 1 VIEW COMPARISON:  MRI 09/29/2022 FINDINGS: Initial film shows 2 needles in place, the upper needle directed between the spinous processes of L4 and L5 and the second needle directed at the S1-2 level, the needles bracketing the L5-S1 disc space. IMPRESSION: Intraoperative localization as above. Electronically Signed   By: Paulina Fusi M.D.   On: 11/16/2022 15:03   DG C-Arm 1-60 Min-No Report  Result Date: 11/16/2022 Fluoroscopy was utilized by the requesting physician.  No radiographic interpretation.   DG C-Arm 1-60 Min-No Report  Result Date: 11/16/2022 Fluoroscopy  was utilized by the requesting physician.  No radiographic interpretation.    Disposition: Discharge disposition: 01-Home or Self Care        POD #1 s/p L5/S1 fusion, doing well   - up with PT/OT, encourage ambulation - Percocet for pain, Robaxin for muscle spasms -Scripts for pain sent to pharmacy electronically  -D/C instructions sheet printed and in chart -D/C today  -F/U in office 2 weeks   Signed: Eilene Ghazi Eh Sauseda 12/07/2022, 2:56 PM

## 2023-03-10 ENCOUNTER — Ambulatory Visit
Admission: RE | Admit: 2023-03-10 | Discharge: 2023-03-10 | Disposition: A | Discharge status: Home / Self Care | Payer: 59 | Source: Ambulatory Visit | Attending: Family Medicine | Admitting: Family Medicine

## 2023-03-10 ENCOUNTER — Other Ambulatory Visit: Payer: Self-pay | Admitting: Family Medicine

## 2023-03-10 DIAGNOSIS — R1011 Right upper quadrant pain: Secondary | ICD-10-CM

## 2023-03-24 ENCOUNTER — Other Ambulatory Visit: Payer: Self-pay | Admitting: Family Medicine

## 2023-03-24 DIAGNOSIS — R109 Unspecified abdominal pain: Secondary | ICD-10-CM

## 2023-03-27 ENCOUNTER — Other Ambulatory Visit (HOSPITAL_COMMUNITY): Payer: Self-pay | Admitting: Gastroenterology

## 2023-03-27 DIAGNOSIS — R1011 Right upper quadrant pain: Secondary | ICD-10-CM

## 2023-04-05 ENCOUNTER — Ambulatory Visit (HOSPITAL_COMMUNITY)
Admission: RE | Admit: 2023-04-05 | Discharge: 2023-04-05 | Disposition: A | Discharge status: Home / Self Care | Payer: 59 | Source: Ambulatory Visit | Attending: Gastroenterology | Admitting: Gastroenterology

## 2023-04-05 DIAGNOSIS — R1011 Right upper quadrant pain: Secondary | ICD-10-CM | POA: Diagnosis present

## 2023-04-06 ENCOUNTER — Encounter (HOSPITAL_COMMUNITY)
Admission: RE | Admit: 2023-04-06 | Discharge: 2023-04-06 | Disposition: A | Discharge status: Home / Self Care | Payer: 59 | Source: Ambulatory Visit | Attending: Gastroenterology | Admitting: Gastroenterology

## 2023-04-06 DIAGNOSIS — R1011 Right upper quadrant pain: Secondary | ICD-10-CM | POA: Insufficient documentation

## 2023-04-06 MED ORDER — TECHNETIUM TC 99M MEBROFENIN IV KIT
5.0600 | PACK | Freq: Once | INTRAVENOUS | Status: AC
  Administered 2023-04-06: 5.06 via INTRAVENOUS

## 2023-04-07 ENCOUNTER — Encounter: Payer: Self-pay | Admitting: Family Medicine

## 2023-04-07 ENCOUNTER — Other Ambulatory Visit (HOSPITAL_COMMUNITY): Payer: Self-pay | Admitting: Gastroenterology

## 2023-04-07 DIAGNOSIS — R1033 Periumbilical pain: Secondary | ICD-10-CM

## 2023-04-15 ENCOUNTER — Ambulatory Visit (HOSPITAL_COMMUNITY)
Admission: RE | Admit: 2023-04-15 | Discharge: 2023-04-15 | Disposition: A | Discharge status: Home / Self Care | Payer: 59 | Source: Ambulatory Visit | Attending: Gastroenterology | Admitting: Gastroenterology

## 2023-04-15 DIAGNOSIS — R1033 Periumbilical pain: Secondary | ICD-10-CM | POA: Insufficient documentation

## 2023-04-15 DIAGNOSIS — E1169 Type 2 diabetes mellitus with other specified complication: Secondary | ICD-10-CM | POA: Diagnosis present

## 2023-04-15 DIAGNOSIS — E669 Obesity, unspecified: Secondary | ICD-10-CM | POA: Insufficient documentation

## 2023-04-15 LAB — POCT I-STAT CREATININE: Creatinine, Ser: 0.9 mg/dL (ref 0.44–1.00)

## 2023-04-15 MED ORDER — IOHEXOL 300 MG/ML  SOLN
100.0000 mL | Freq: Once | INTRAMUSCULAR | Status: AC | PRN
  Administered 2023-04-15: 100 mL via INTRAVENOUS

## 2023-06-10 IMAGING — NM NM HEPATO W/GB/PHARM/[PERSON_NAME]
2 series · 12 of 12 positions shown · non-contrast
Comparison: 11/22/2017

CLINICAL DATA: RIGHT upper quadrant pain for past month

EXAM:
NUCLEAR MEDICINE HEPATOBILIARY IMAGING WITH GALLBLADDER EF
TECHNIQUE: Sequential images of the abdomen were obtained [DATE] minutes
following intravenous administration of radiopharmaceutical. After
oral ingestion of Ensure, gallbladder ejection fraction was
determined. At 60 min, normal ejection fraction is greater than 33%.
RADIOPHARMACEUTICALS:  5.5 mCi 7c-FFm  Choletec IV

[Series 1: hida scan 1hr · 3.28mm/px · 6 of 60 frames shown]
[frame 6/60]
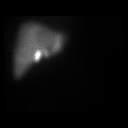
[frame 16/60]
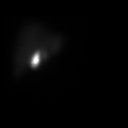
[frame 26/60]
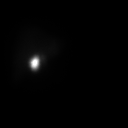
[frame 36/60]
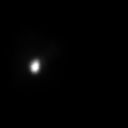
[frame 46/60]
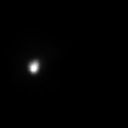
[frame 56/60]
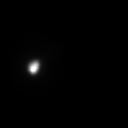

[Series 1: gbef · 3.28mm/px · 6 of 60 frames shown]
[frame 6/60]
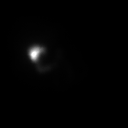
[frame 16/60]
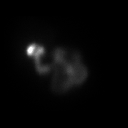
[frame 26/60]
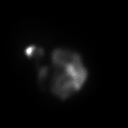
[frame 36/60]
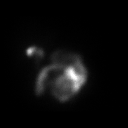
[frame 46/60]
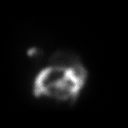
[frame 56/60]
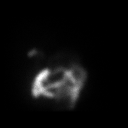

[12 of 12 positions shown; findings below may reference images not displayed]

FINDINGS: Normal tracer extraction from bloodstream indicating normal
hepatocellular function.

Normal excretion of tracer into biliary tree.

Gallbladder visualized at 7 min.

Small bowel not visualized until following fatty meal stimulation.

No hepatic retention of tracer.

Near complete emptying of tracer from gallbladder following fatty
meal stimulation.

Calculated gallbladder ejection fraction is over 95%, normal.

Normal gallbladder ejection fraction following Ensure ingestion is
greater than 33% at 1 hour.
IMPRESSION: Normal exam.

## 2023-06-17 IMAGING — CT CT ABD-PELV W/ CM
2 of 5 series · 15 of 46 positions shown, 17 images · IV contrast (agent unspecified)
Comparison: 04/05/2020

CLINICAL DATA: Right mid abdominal pain after eating. Bloating and
distension.

EXAM:
CT ABDOMEN AND PELVIS WITH CONTRAST
TECHNIQUE: Multidetector CT imaging of the abdomen and pelvis was performed
using the standard protocol following bolus administration of
intravenous contrast.

[Series 2: abd pelvis 5.00 · axial · 0.66mm/px · z∈[-1481,-1056]mm · 12 of 97 slices shown, 14 images]
[im 6/97  soft-tissue]
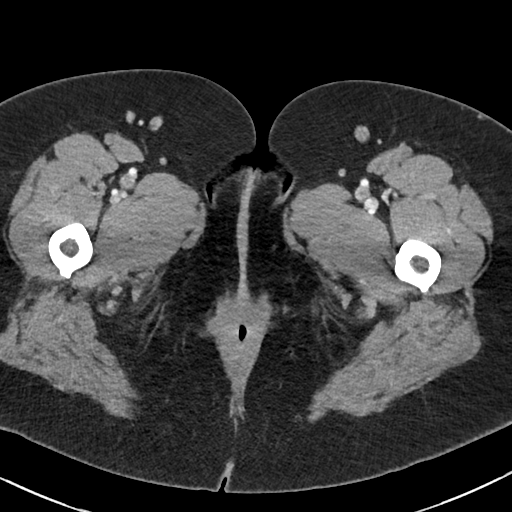
[im 6/97  bone]
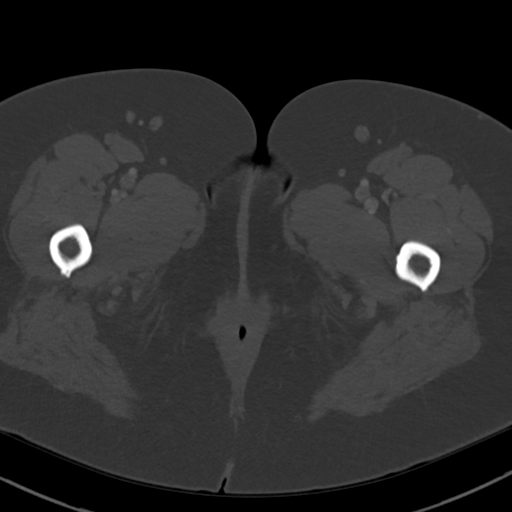
[im 16/97  soft-tissue]
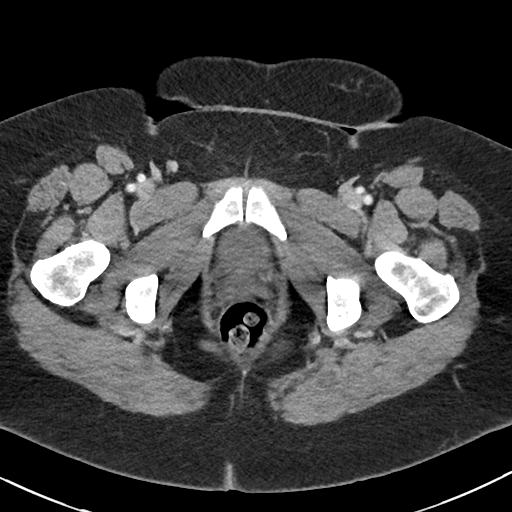
[im 21/97  soft-tissue]
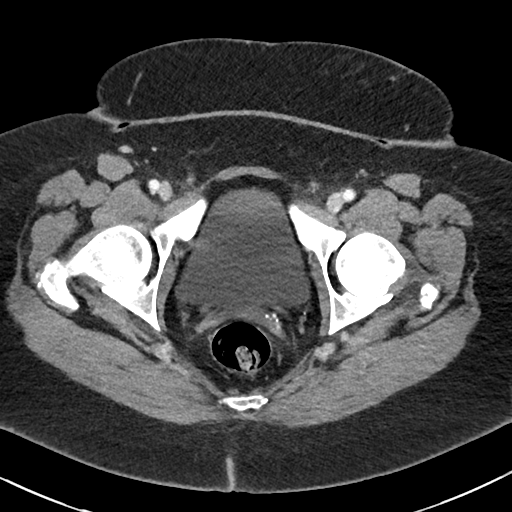
[im 31/97  soft-tissue]
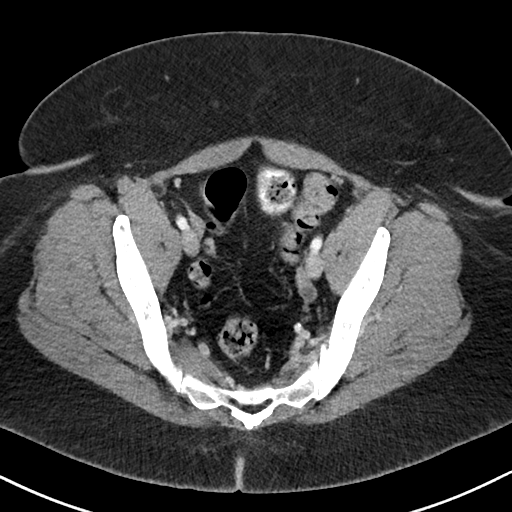
[im 36/97  soft-tissue]
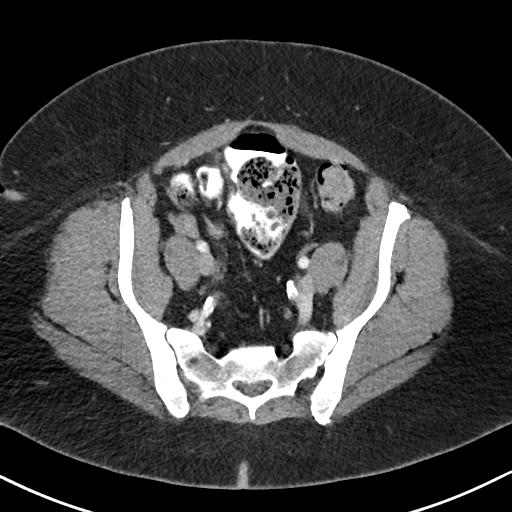
[im 46/97  soft-tissue]
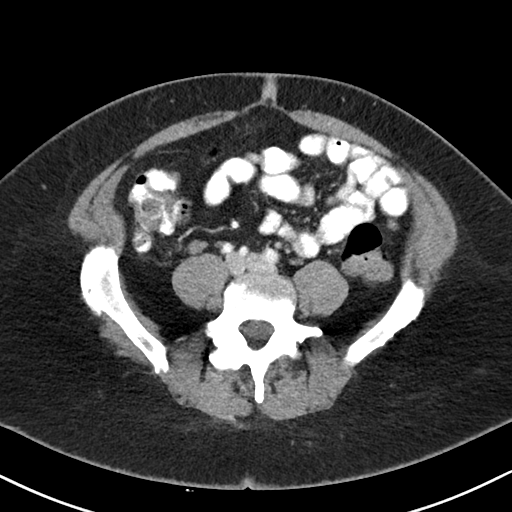
[im 51/97  soft-tissue]
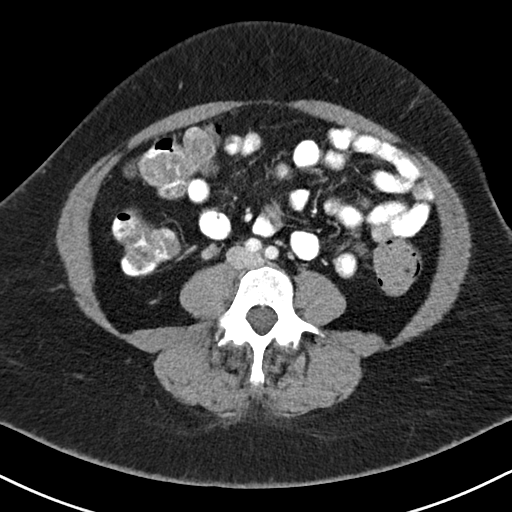
[im 61/97  soft-tissue]
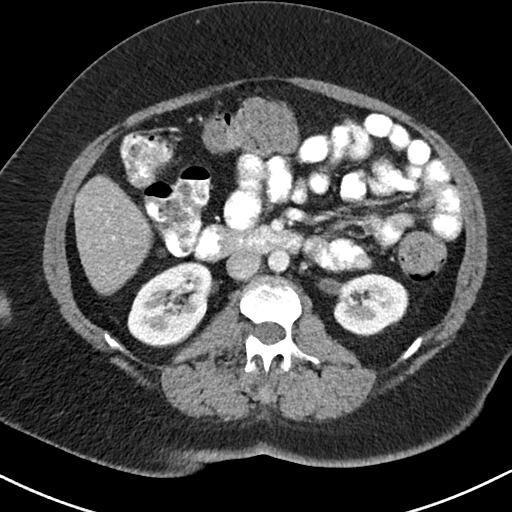
[im 66/97  soft-tissue]
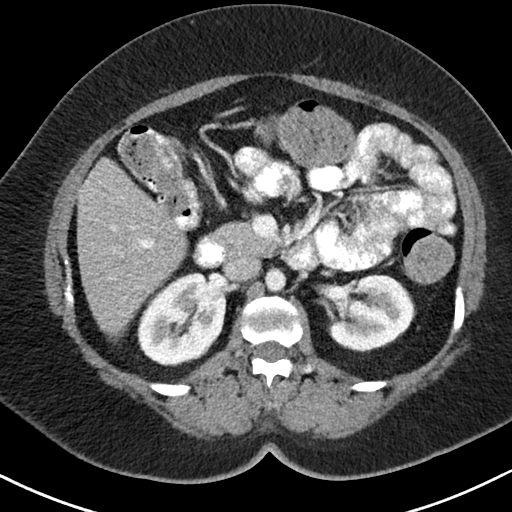
[im 66/97  bone]
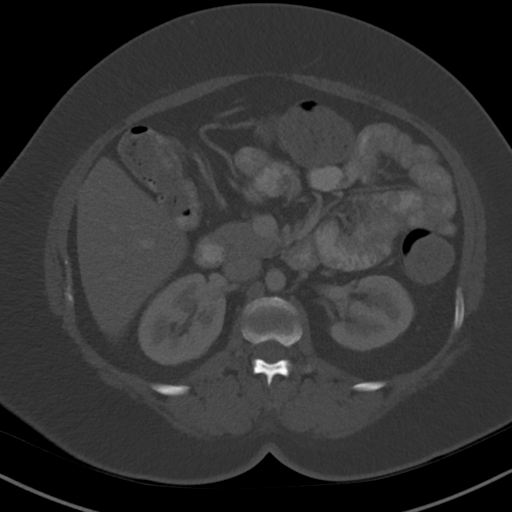
[im 76/97  soft-tissue]
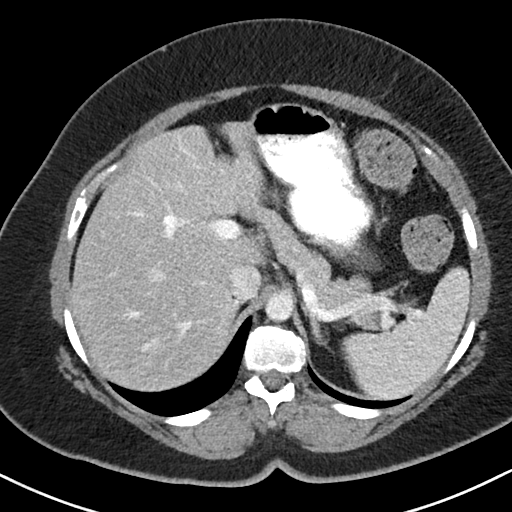
[im 81/97  soft-tissue]
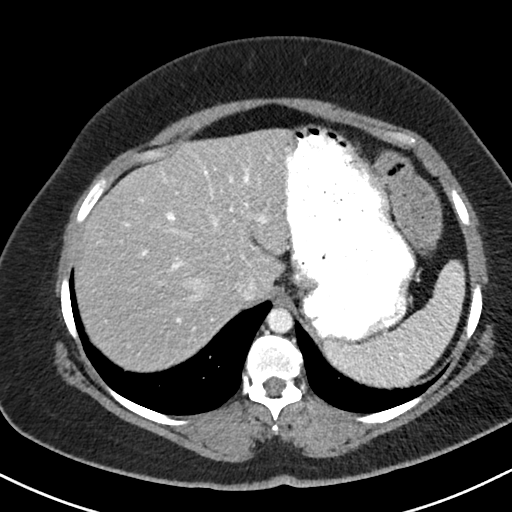
[im 91/97  soft-tissue]
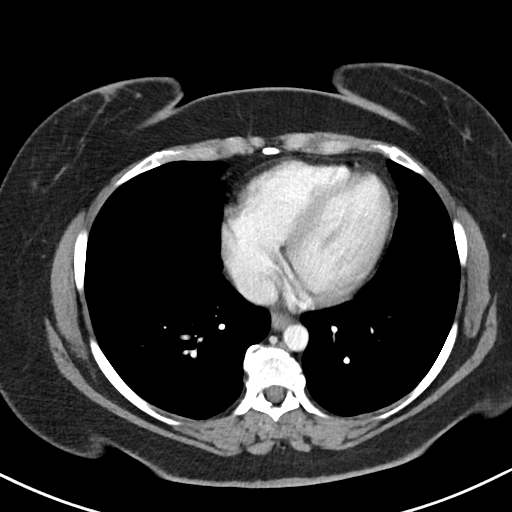

[Series 4: coronals abd pelvis 2.00 cor · coronal · 0.66mm/px · 3 of 142 slices shown]
[im 48/142  soft-tissue]
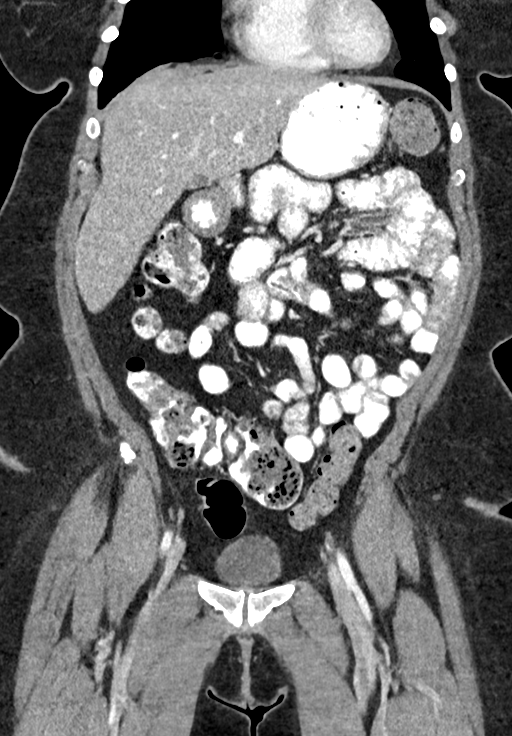
[im 63/142  soft-tissue]
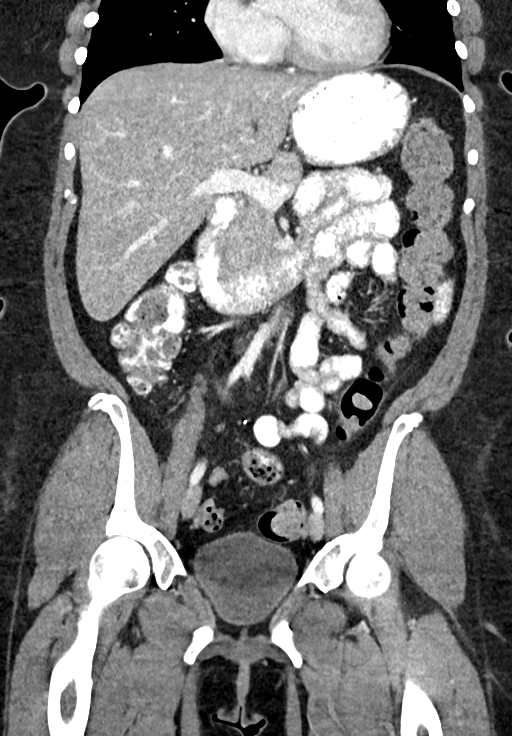
[im 79/142  soft-tissue]
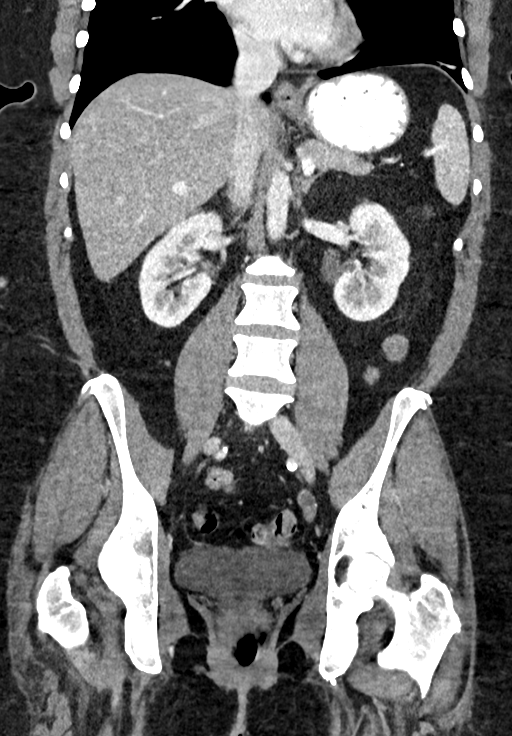

[15 of 46 positions shown; findings below may reference images not displayed]

RADIATION DOSE REDUCTION: This exam was performed according to the
departmental dose-optimization program which includes automated
exposure control, adjustment of the mA and/or kV according to
patient size and/or use of iterative reconstruction technique.

CONTRAST:  100mL OMNIPAQUE IOHEXOL 300 MG/ML  SOLN
FINDINGS: Lower chest: Unremarkable

Hepatobiliary: No suspicious focal abnormality within the liver
parenchyma. There is no evidence for gallstones, gallbladder wall
thickening, or pericholecystic fluid. No intrahepatic or
extrahepatic biliary dilation.

Pancreas: No focal mass lesion. No dilatation of the main duct. No
intraparenchymal cyst. No peripancreatic edema.

Spleen: No splenomegaly. No focal mass lesion.

Adrenals/Urinary Tract: No adrenal nodule or mass. Kidneys
unremarkable. No evidence for hydroureter. The urinary bladder
appears normal for the degree of distention.

Stomach/Bowel: Stomach is unremarkable. No gastric wall thickening.
No evidence of outlet obstruction. Duodenum is normally positioned
as is the ligament of Treitz. No small bowel wall thickening. No
small bowel dilatation. The terminal ileum is normal.
Nonvisualization of the appendix is consistent with the reported
history of appendectomy. No gross colonic mass. No colonic wall
thickening. Moderate stool volume.

Vascular/Lymphatic: There is mild atherosclerotic calcification of
the abdominal aorta without aneurysm. There is no gastrohepatic or
hepatoduodenal ligament lymphadenopathy. No retroperitoneal or
mesenteric lymphadenopathy. No pelvic sidewall lymphadenopathy.

Reproductive: The uterus is surgically absent. There is no adnexal
mass.

Other: No intraperitoneal free fluid.

Musculoskeletal: No worrisome lytic or sclerotic osseous
abnormality.
IMPRESSION: 1. No acute findings in the abdomen or pelvis. Specifically, no
findings to explain the patient's history of right-sided abdominal
pain.
2. Moderate stool volume.
3. Aortic Atherosclerosis ([id number]).

## 2023-11-20 ENCOUNTER — Other Ambulatory Visit: Payer: Self-pay | Admitting: Orthopedic Surgery

## 2023-11-20 DIAGNOSIS — M5416 Radiculopathy, lumbar region: Secondary | ICD-10-CM

## 2023-12-01 ENCOUNTER — Ambulatory Visit
Admission: RE | Admit: 2023-12-01 | Discharge: 2023-12-01 | Disposition: A | Discharge status: Home / Self Care | Source: Ambulatory Visit | Attending: Orthopedic Surgery | Admitting: Orthopedic Surgery

## 2023-12-01 DIAGNOSIS — M5416 Radiculopathy, lumbar region: Secondary | ICD-10-CM

## 2023-12-01 MED ORDER — IOPAMIDOL (ISOVUE-M 200) INJECTION 41%: 1.0000 mL | Freq: Once | INTRAMUSCULAR | Status: DC | PRN
